# Patient Record
Sex: Female | Born: 1980 | Race: Black or African American | Hispanic: No | Marital: Single | Smoking: Never smoker
Health system: Southern US, Community
[De-identification: ages and names within clinical notes are randomized; demographics above are authoritative.]

## PROBLEM LIST (undated history)

## (undated) DIAGNOSIS — H6990 Unspecified Eustachian tube disorder, unspecified ear: Secondary | ICD-10-CM

## (undated) DIAGNOSIS — N9089 Other specified noninflammatory disorders of vulva and perineum: Secondary | ICD-10-CM

## (undated) DIAGNOSIS — C4920 Malignant neoplasm of connective and soft tissue of unspecified lower limb, including hip: Secondary | ICD-10-CM

## (undated) DIAGNOSIS — L03119 Cellulitis of unspecified part of limb: Secondary | ICD-10-CM

## (undated) DIAGNOSIS — H6980 Other specified disorders of Eustachian tube, unspecified ear: Secondary | ICD-10-CM

## (undated) DIAGNOSIS — M4120 Other idiopathic scoliosis, site unspecified: Secondary | ICD-10-CM

## (undated) DIAGNOSIS — J309 Allergic rhinitis, unspecified: Secondary | ICD-10-CM

## (undated) DIAGNOSIS — L02419 Cutaneous abscess of limb, unspecified: Secondary | ICD-10-CM

## (undated) DIAGNOSIS — R87619 Unspecified abnormal cytological findings in specimens from cervix uteri: Secondary | ICD-10-CM

## (undated) HISTORY — DX: Other specified noninflammatory disorders of vulva and perineum: N90.89

## (undated) HISTORY — PX: NO PAST SURGERIES: SHX2092

## (undated) HISTORY — DX: Allergic rhinitis, unspecified: J30.9

## (undated) HISTORY — PX: FOOT SURGERY: SHX648

## (undated) HISTORY — DX: Other idiopathic scoliosis, site unspecified: M41.20

## (undated) HISTORY — PX: KNEE SURGERY: SHX244

## (undated) HISTORY — DX: Cellulitis of unspecified part of limb: L03.119

## (undated) HISTORY — DX: Cutaneous abscess of limb, unspecified: L02.419

## (undated) HISTORY — DX: Unspecified eustachian tube disorder, unspecified ear: H69.90

## (undated) HISTORY — DX: Malignant neoplasm of connective and soft tissue of unspecified lower limb, including hip: C49.20

## (undated) HISTORY — DX: Unspecified abnormal cytological findings in specimens from cervix uteri: R87.619

## (undated) HISTORY — DX: Other specified disorders of Eustachian tube, unspecified ear: H69.80

---
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
## 2003-12-03 DIAGNOSIS — C4920 Malignant neoplasm of connective and soft tissue of unspecified lower limb, including hip: Secondary | ICD-10-CM

## 2003-12-03 HISTORY — DX: Malignant neoplasm of connective and soft tissue of unspecified lower limb, including hip: C49.20

## 2004-06-11 ENCOUNTER — Encounter (INDEPENDENT_AMBULATORY_CARE_PROVIDER_SITE_OTHER): Payer: Self-pay | Admitting: *Deleted

## 2004-06-11 ENCOUNTER — Ambulatory Visit (HOSPITAL_BASED_OUTPATIENT_CLINIC_OR_DEPARTMENT_OTHER): Admission: RE | Admit: 2004-06-11 | Discharge: 2004-06-11 | Payer: Self-pay | Admitting: General Surgery

## 2004-06-11 ENCOUNTER — Ambulatory Visit (HOSPITAL_COMMUNITY): Admission: RE | Admit: 2004-06-11 | Discharge: 2004-06-11 | Payer: Self-pay | Admitting: General Surgery

## 2004-10-17 ENCOUNTER — Ambulatory Visit: Payer: Self-pay | Admitting: Family Medicine

## 2004-10-22 ENCOUNTER — Ambulatory Visit (HOSPITAL_COMMUNITY): Admission: RE | Admit: 2004-10-22 | Discharge: 2004-10-22 | Payer: Self-pay | Admitting: Internal Medicine

## 2005-01-01 ENCOUNTER — Other Ambulatory Visit: Admission: RE | Admit: 2005-01-01 | Discharge: 2005-01-01 | Payer: Self-pay | Admitting: Obstetrics and Gynecology

## 2005-01-01 ENCOUNTER — Ambulatory Visit: Payer: Self-pay | Admitting: Obstetrics and Gynecology

## 2005-01-17 ENCOUNTER — Ambulatory Visit: Payer: Self-pay | Admitting: Obstetrics and Gynecology

## 2005-07-18 ENCOUNTER — Ambulatory Visit: Payer: Self-pay | Admitting: Obstetrics and Gynecology

## 2006-01-04 ENCOUNTER — Encounter: Payer: Self-pay | Admitting: Internal Medicine

## 2006-02-04 ENCOUNTER — Ambulatory Visit: Payer: Self-pay | Admitting: Hematology & Oncology

## 2006-02-18 ENCOUNTER — Encounter: Payer: Self-pay | Admitting: Internal Medicine

## 2006-03-10 ENCOUNTER — Encounter: Payer: Self-pay | Admitting: Internal Medicine

## 2006-03-10 ENCOUNTER — Other Ambulatory Visit: Admission: RE | Admit: 2006-03-10 | Discharge: 2006-03-10 | Payer: Self-pay | Admitting: Obstetrics and Gynecology

## 2006-03-10 HISTORY — PX: COLPOSCOPY: SHX161

## 2006-09-13 ENCOUNTER — Encounter: Payer: Self-pay | Admitting: Internal Medicine

## 2006-09-15 ENCOUNTER — Other Ambulatory Visit: Admission: RE | Admit: 2006-09-15 | Discharge: 2006-09-15 | Payer: Self-pay | Admitting: Obstetrics and Gynecology

## 2006-12-02 DIAGNOSIS — R87619 Unspecified abnormal cytological findings in specimens from cervix uteri: Secondary | ICD-10-CM

## 2006-12-02 HISTORY — DX: Unspecified abnormal cytological findings in specimens from cervix uteri: R87.619

## 2007-01-21 ENCOUNTER — Encounter: Payer: Self-pay | Admitting: Internal Medicine

## 2007-01-21 LAB — CONVERTED CEMR LAB: Pap Smear: ABNORMAL

## 2007-02-19 ENCOUNTER — Encounter: Payer: Self-pay | Admitting: Internal Medicine

## 2007-02-19 ENCOUNTER — Encounter: Admission: RE | Admit: 2007-02-19 | Discharge: 2007-02-19 | Payer: Self-pay | Admitting: Obstetrics and Gynecology

## 2007-03-31 ENCOUNTER — Emergency Department (HOSPITAL_COMMUNITY): Admission: EM | Admit: 2007-03-31 | Discharge: 2007-03-31 | Payer: Self-pay | Admitting: Family Medicine

## 2007-04-01 ENCOUNTER — Encounter: Payer: Self-pay | Admitting: Internal Medicine

## 2007-04-07 ENCOUNTER — Encounter: Payer: Self-pay | Admitting: Internal Medicine

## 2007-04-07 LAB — CONVERTED CEMR LAB: TSH: 1.847 microintl units/mL

## 2007-04-23 ENCOUNTER — Encounter: Payer: Self-pay | Admitting: Internal Medicine

## 2007-07-10 ENCOUNTER — Ambulatory Visit: Payer: Self-pay | Admitting: Internal Medicine

## 2007-07-10 ENCOUNTER — Ambulatory Visit: Payer: Self-pay | Admitting: *Deleted

## 2007-07-11 ENCOUNTER — Encounter (INDEPENDENT_AMBULATORY_CARE_PROVIDER_SITE_OTHER): Payer: Self-pay | Admitting: Nurse Practitioner

## 2007-07-11 LAB — CONVERTED CEMR LAB
ALT: 13 units/L (ref 0–35)
AST: 15 units/L (ref 0–37)
Albumin: 4.4 g/dL (ref 3.5–5.2)
Alkaline Phosphatase: 52 units/L (ref 39–117)
BUN: 10 mg/dL (ref 6–23)
Basophils Absolute: 0 10*3/uL (ref 0.0–0.1)
Basophils Relative: 0 % (ref 0–1)
CO2: 23 meq/L (ref 19–32)
Calcium: 8.8 mg/dL (ref 8.4–10.5)
Chloride: 106 meq/L (ref 96–112)
Creatinine, Ser: 0.8 mg/dL (ref 0.40–1.20)
Eosinophils Absolute: 0.1 10*3/uL (ref 0.0–0.7)
Eosinophils Relative: 2 % (ref 0–5)
Glucose, Bld: 78 mg/dL (ref 70–99)
HCT: 40.2 % (ref 36.0–46.0)
Hemoglobin: 14.1 g/dL (ref 12.0–15.0)
Lymphocytes Relative: 41 % (ref 12–46)
Lymphs Abs: 1.9 10*3/uL (ref 0.7–3.3)
MCHC: 35.1 g/dL (ref 30.0–36.0)
MCV: 79.8 fL (ref 78.0–100.0)
Monocytes Absolute: 0.3 10*3/uL (ref 0.2–0.7)
Monocytes Relative: 6 % (ref 3–11)
Neutro Abs: 2.4 10*3/uL (ref 1.7–7.7)
Neutrophils Relative %: 52 % (ref 43–77)
Platelets: 243 10*3/uL (ref 150–400)
Potassium: 4.1 meq/L (ref 3.5–5.3)
RBC: 5.04 M/uL (ref 3.87–5.11)
RDW: 14.3 % — ABNORMAL HIGH (ref 11.5–14.0)
Sodium: 139 meq/L (ref 135–145)
TSH: 1.71 microintl units/mL (ref 0.350–5.50)
Total Bilirubin: 0.4 mg/dL (ref 0.3–1.2)
Total Protein: 7.5 g/dL (ref 6.0–8.3)
WBC: 4.7 10*3/uL (ref 4.0–10.5)

## 2007-07-17 ENCOUNTER — Ambulatory Visit (HOSPITAL_COMMUNITY): Admission: RE | Admit: 2007-07-17 | Discharge: 2007-07-17 | Payer: Self-pay | Admitting: Internal Medicine

## 2007-07-17 ENCOUNTER — Ambulatory Visit: Payer: Self-pay | Admitting: Internal Medicine

## 2007-07-17 ENCOUNTER — Encounter: Payer: Self-pay | Admitting: Internal Medicine

## 2007-07-17 ENCOUNTER — Encounter (INDEPENDENT_AMBULATORY_CARE_PROVIDER_SITE_OTHER): Payer: Self-pay | Admitting: Nurse Practitioner

## 2007-07-17 DIAGNOSIS — M25579 Pain in unspecified ankle and joints of unspecified foot: Secondary | ICD-10-CM

## 2007-07-17 DIAGNOSIS — R87619 Unspecified abnormal cytological findings in specimens from cervix uteri: Secondary | ICD-10-CM | POA: Insufficient documentation

## 2007-07-18 ENCOUNTER — Encounter (INDEPENDENT_AMBULATORY_CARE_PROVIDER_SITE_OTHER): Payer: Self-pay | Admitting: Nurse Practitioner

## 2007-07-18 LAB — CONVERTED CEMR LAB
Chlamydia, DNA Probe: NEGATIVE
GC Probe Amp, Genital: NEGATIVE

## 2007-07-28 ENCOUNTER — Encounter (INDEPENDENT_AMBULATORY_CARE_PROVIDER_SITE_OTHER): Payer: Self-pay | Admitting: Nurse Practitioner

## 2007-07-28 DIAGNOSIS — J309 Allergic rhinitis, unspecified: Secondary | ICD-10-CM | POA: Insufficient documentation

## 2007-07-29 DIAGNOSIS — K029 Dental caries, unspecified: Secondary | ICD-10-CM | POA: Insufficient documentation

## 2007-08-04 ENCOUNTER — Ambulatory Visit: Payer: Self-pay | Admitting: Internal Medicine

## 2007-08-06 ENCOUNTER — Ambulatory Visit: Payer: Self-pay | Admitting: Family Medicine

## 2007-08-10 ENCOUNTER — Telehealth (INDEPENDENT_AMBULATORY_CARE_PROVIDER_SITE_OTHER): Payer: Self-pay | Admitting: Nurse Practitioner

## 2007-08-12 ENCOUNTER — Encounter (INDEPENDENT_AMBULATORY_CARE_PROVIDER_SITE_OTHER): Payer: Self-pay | Admitting: Nurse Practitioner

## 2007-08-18 ENCOUNTER — Encounter (INDEPENDENT_AMBULATORY_CARE_PROVIDER_SITE_OTHER): Payer: Self-pay | Admitting: Nurse Practitioner

## 2007-08-19 ENCOUNTER — Encounter (INDEPENDENT_AMBULATORY_CARE_PROVIDER_SITE_OTHER): Payer: Self-pay | Admitting: *Deleted

## 2007-09-18 ENCOUNTER — Other Ambulatory Visit: Admission: RE | Admit: 2007-09-18 | Discharge: 2007-09-18 | Payer: Self-pay | Admitting: Obstetrics & Gynecology

## 2007-09-18 ENCOUNTER — Ambulatory Visit: Payer: Self-pay | Admitting: Obstetrics & Gynecology

## 2007-09-18 ENCOUNTER — Encounter (INDEPENDENT_AMBULATORY_CARE_PROVIDER_SITE_OTHER): Payer: Self-pay | Admitting: Nurse Practitioner

## 2007-10-02 ENCOUNTER — Ambulatory Visit: Payer: Self-pay | Admitting: Gynecology

## 2008-03-22 ENCOUNTER — Encounter (INDEPENDENT_AMBULATORY_CARE_PROVIDER_SITE_OTHER): Payer: Self-pay | Admitting: Nurse Practitioner

## 2008-04-01 ENCOUNTER — Ambulatory Visit: Payer: Self-pay | Admitting: Gynecology

## 2008-04-01 ENCOUNTER — Encounter (INDEPENDENT_AMBULATORY_CARE_PROVIDER_SITE_OTHER): Payer: Self-pay | Admitting: Gynecology

## 2008-09-02 IMAGING — CR DG FEET 3 VIEWS BILAT
6 series · 6 of 6 positions shown · non-contrast
Comparison: none

CLINICAL DATA: Right foot swelling, left foot pain. 
 BILATERAL FEET ? 3 VIEW (EACH):
 Three views of the left foot and 3 views of the right foot were obtained.

[t foot ap left]
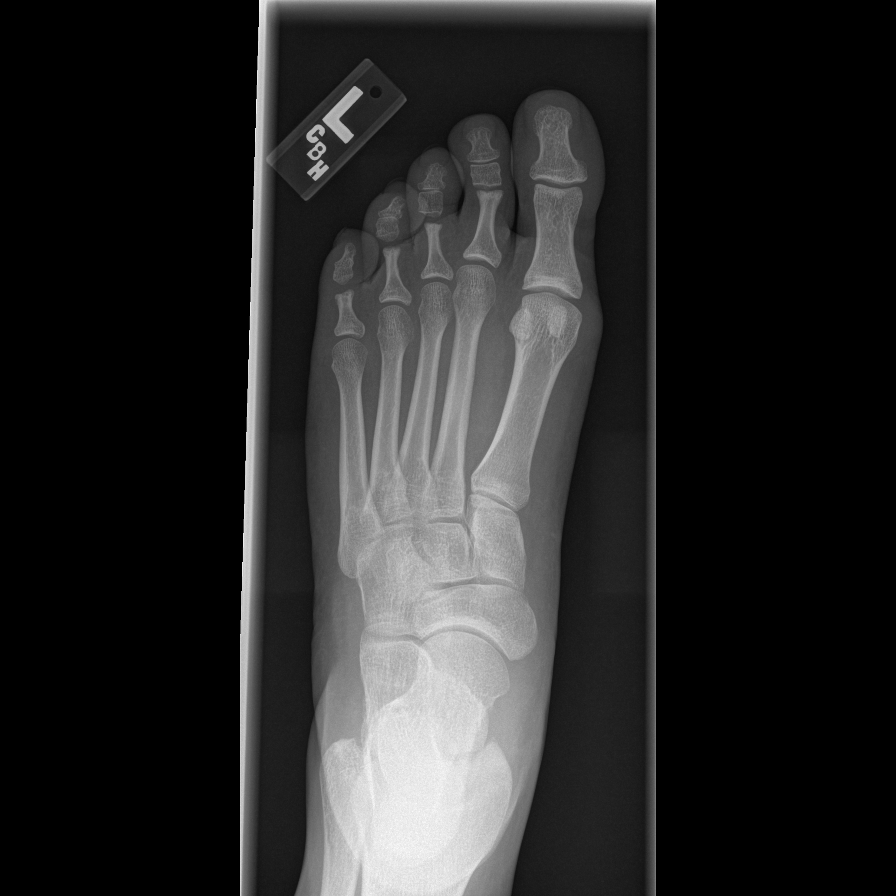

[t foot oblique left]
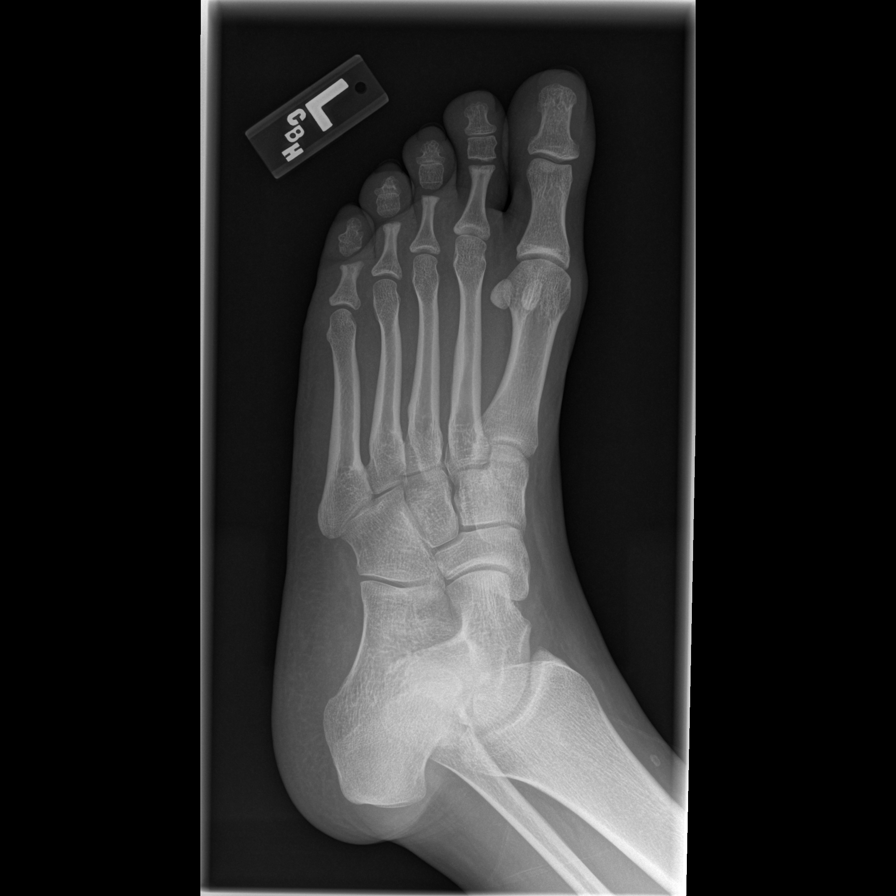

[t foot lat left]
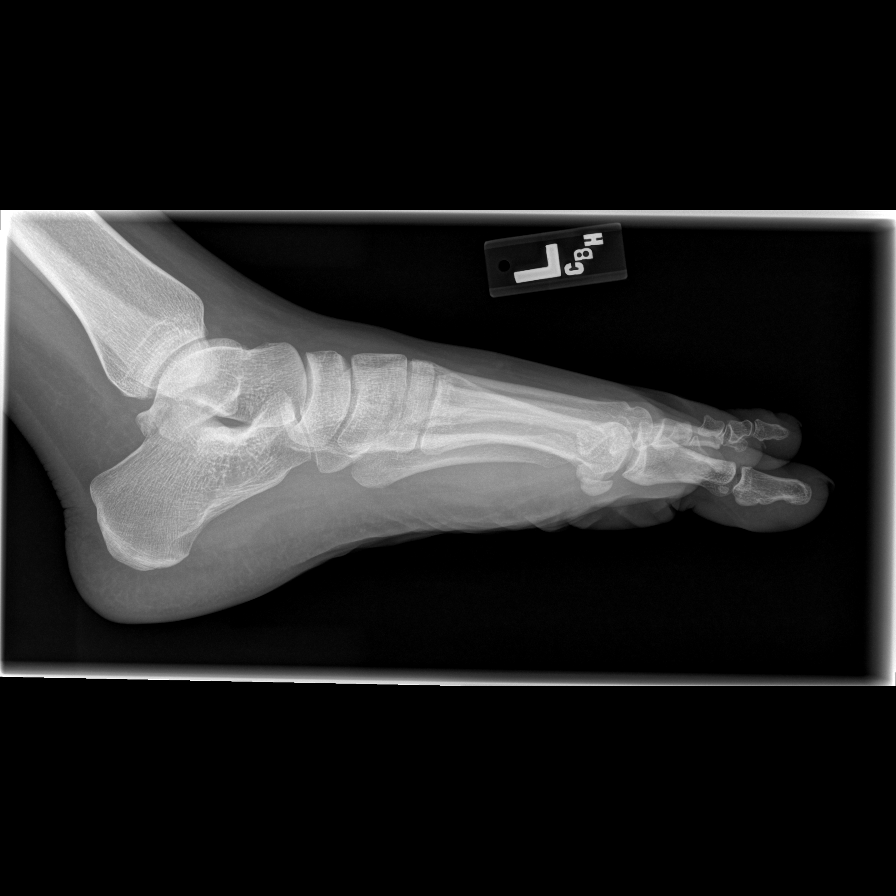

[t foot ap right]
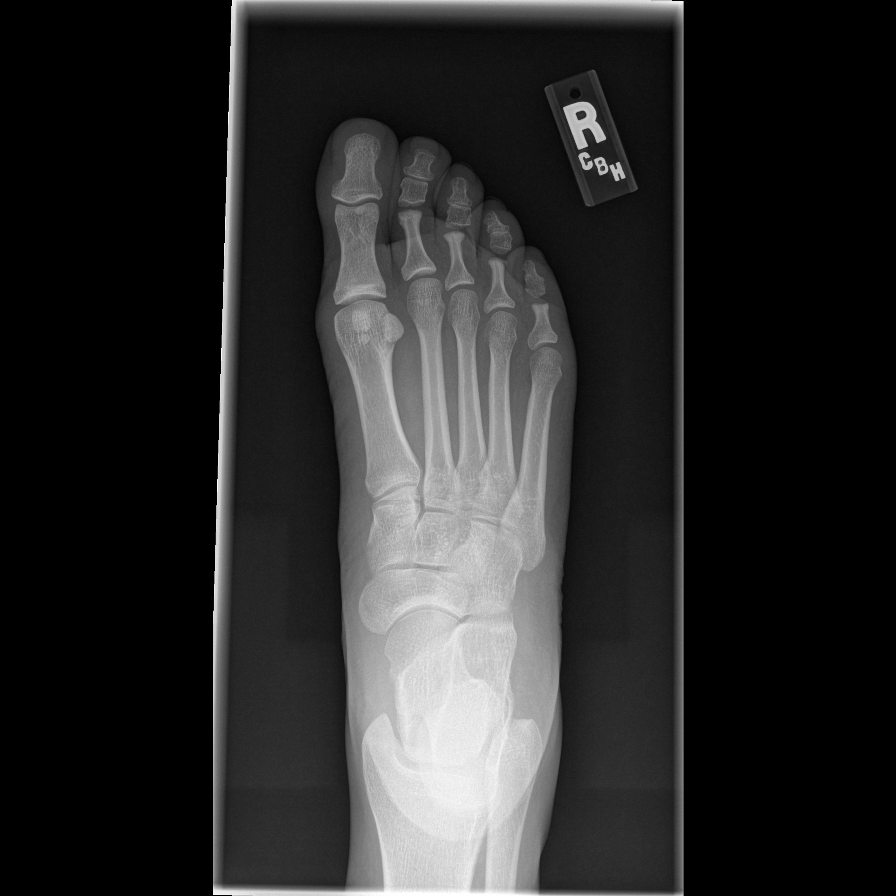

[t foot oblique right]
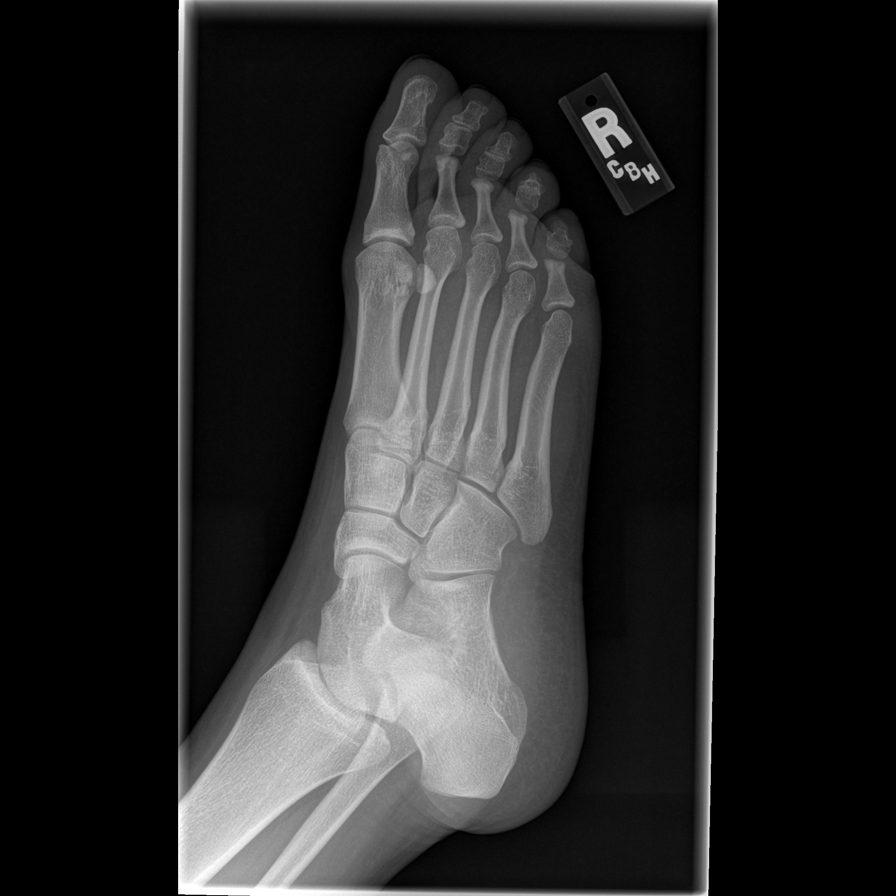

[t foot lat right]
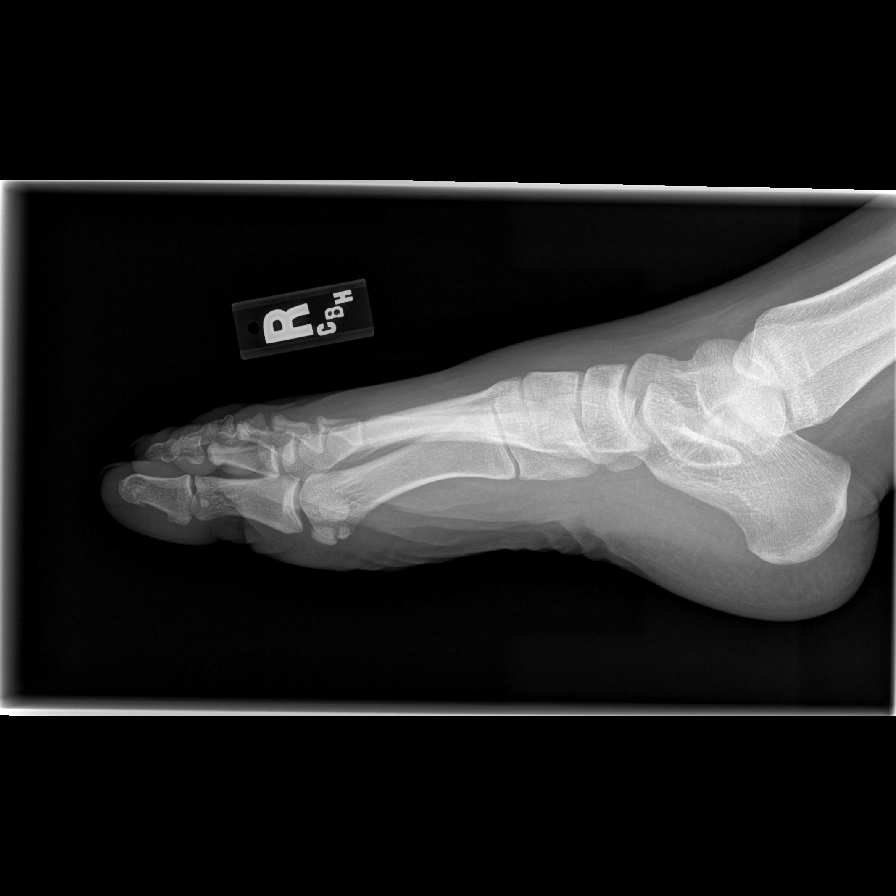

[6 of 6 positions shown; findings below may reference images not displayed]

FINDINGS: On the left, there is abnormality of the distal portion of the proximal phalanges of the 3rd, 4th and 5th toes. This appears longstanding and probably is a congenital abnormality.  No erosive process is seen.  No fracture is noted.
IMPRESSION: Probable congenital abnormality of the proximal phalanges of the 3rd, 4th and 5th toes.  No acute abnormality.
 RIGHT FOOT:
FINDINGS: Three views of the right foot show, similarly to the left, that there is abnormality of the distal portion of the proximal phalanges of the 2nd, 3rd, 4th and 5th toes.  This does appear to be a longstanding process and most likely is a congenital abnormality.  No erosion is seen.  No fracture is noted.
IMPRESSION: Abnormalities of the distal portion of the proximal phalanges of the 2nd, 3rd, 4th and 5th toes of the right foot, most likely representing a chronic process.  No acute abnormality.

## 2008-10-07 ENCOUNTER — Encounter: Payer: Self-pay | Admitting: Obstetrics & Gynecology

## 2008-10-07 ENCOUNTER — Ambulatory Visit: Payer: Self-pay | Admitting: Obstetrics & Gynecology

## 2009-03-15 ENCOUNTER — Emergency Department (HOSPITAL_COMMUNITY): Admission: EM | Admit: 2009-03-15 | Discharge: 2009-03-16 | Payer: Self-pay | Admitting: Emergency Medicine

## 2009-03-17 ENCOUNTER — Emergency Department (HOSPITAL_COMMUNITY): Admission: EM | Admit: 2009-03-17 | Discharge: 2009-03-18 | Payer: Self-pay | Admitting: Emergency Medicine

## 2009-03-18 ENCOUNTER — Encounter: Payer: Self-pay | Admitting: Internal Medicine

## 2009-03-18 DIAGNOSIS — M549 Dorsalgia, unspecified: Secondary | ICD-10-CM | POA: Insufficient documentation

## 2009-03-18 LAB — CONVERTED CEMR LAB
ALT: 18 units/L
AST: 20 units/L
Albumin: 4.3 g/dL
Alkaline Phosphatase: 62 units/L
HCT: 46.3 %
Hemoglobin: 16.1 g/dL
Lymphocytes, automated: 19 %
MCV: 82.9 fL
Monocytes Relative: 2 %
Neutrophils Relative %: 79 %
Platelets: 260 10*3/uL
RBC: 5.58 M/uL
RDW: 14.5 %
Total Bilirubin: 0.6 mg/dL
Total Protein: 8.3 g/dL
WBC: 6 10*3/uL

## 2009-04-01 LAB — CONVERTED CEMR LAB: Pap Smear: NEGATIVE

## 2009-04-14 ENCOUNTER — Ambulatory Visit: Payer: Self-pay | Admitting: Obstetrics & Gynecology

## 2009-04-14 ENCOUNTER — Encounter: Payer: Self-pay | Admitting: Obstetrics & Gynecology

## 2009-12-06 ENCOUNTER — Emergency Department (HOSPITAL_BASED_OUTPATIENT_CLINIC_OR_DEPARTMENT_OTHER): Admission: EM | Admit: 2009-12-06 | Discharge: 2009-12-06 | Payer: Self-pay | Admitting: Emergency Medicine

## 2009-12-21 ENCOUNTER — Encounter: Payer: Self-pay | Admitting: Internal Medicine

## 2009-12-22 ENCOUNTER — Ambulatory Visit: Payer: Self-pay | Admitting: Internal Medicine

## 2009-12-22 DIAGNOSIS — M4120 Other idiopathic scoliosis, site unspecified: Secondary | ICD-10-CM | POA: Insufficient documentation

## 2010-04-18 ENCOUNTER — Ambulatory Visit: Payer: Self-pay | Admitting: Obstetrics and Gynecology

## 2010-04-18 LAB — CONVERTED CEMR LAB: Pap Smear: NEGATIVE

## 2010-09-16 ENCOUNTER — Emergency Department (HOSPITAL_COMMUNITY)
Admission: EM | Admit: 2010-09-16 | Discharge: 2010-09-16 | Payer: Self-pay | Source: Home / Self Care | Admitting: Family Medicine

## 2010-12-28 ENCOUNTER — Encounter (INDEPENDENT_AMBULATORY_CARE_PROVIDER_SITE_OTHER): Payer: Self-pay | Admitting: Nurse Practitioner

## 2010-12-30 LAB — CONVERTED CEMR LAB: Pap Smear: ABNORMAL

## 2011-01-01 NOTE — Assessment & Plan Note (Signed)
Summary: NEW [employer] PT-BACK PROBLEM/BREAST- PKG--STC   Vital Signs:  Patient profile:   30 year old female Height:      67.5 inches (171.45 cm) Weight:      178.12 pounds (80.96 kg) BMI:     27.59 O2 Sat:      97 % on Room air Temp:     97.3 degrees F (36.28 degrees C) oral Pulse rate:   76 / minute BP sitting:   110 / 82  (left arm) Cuff size:   large  Vitals Entered By: Orlan Leavens (December 22, 2009 2:18 PM)  O2 Flow:  Room air CC: New patient/ Req referral for breast reduction Is Patient Diabetic? No Pain Assessment Patient in pain? no        Primary Care Provider:  Newt Lukes MD  CC:  New patient/ Req referral for breast reduction.  History of Present Illness: new pt to me and our practice - here to est care  c/o chronic right side shoulder and back pain - initially seen by dr. Regino Schultze at [facility] - (03/2009) then underwent PT for same in spring 2010 - pain improved until recently - denies precipitating event new flare of same pain about 3 weeks ago - now felt related to generous breast size -  pt requests referral for surgical reduction - also note mild-mod right apex scoli on t-spine xray 03/2009 -  pt aware of same denies any recent trauma or injury to shoulder or back  Preventive Screening-Counseling & Management  Alcohol-Tobacco     Alcohol drinks/day: <1     Alcohol Counseling: not indicated; patient does not drink     Smoking Status: never     Tobacco Counseling: not indicated; no tobacco use  Caffeine-Diet-Exercise     Does Patient Exercise: yes     Exercise Counseling: not indicated; exercise is adequate     Depression Counseling: not indicated; screening negative for depression  Safety-Violence-Falls     Firearms in the Home: no firearms in the home     Smoke Detectors: yes     Violence in the Home: not applicable     Fall Risk Counseling: not indicated; no significant falls noted  Clinical Review Panels:  Prevention   Last Pap  Smear:  Interpretation/Result:Negative for intraepithelial Lesion or Malignancy.    (04/01/2009)  Immunizations   Last Tetanus Booster:  Tdap (12/22/2009)   Last Flu Vaccine:  Historical (07/03/2004)  CBC   WBC:  6.0 (03/18/2009)   RBC:  5.58 (03/18/2009)   Hgb:  16.1 (03/18/2009)   Hct:  46.3 (03/18/2009)   Platelets:  260 (03/18/2009)   MCV  82.9 (03/18/2009)   MCHC  35.1 (07/11/2007)   RDW  14.5 (03/18/2009)   PMN:  79 (03/18/2009)   Lymphs:  41 (07/11/2007)   Monos:  2 (03/18/2009)   Eosinophils:  2 (07/11/2007)   Basophil:  0 (07/11/2007)  Complete Metabolic Panel   Glucose:  78 (07/11/2007)   Sodium:  139 (07/11/2007)   Potassium:  4.1 (07/11/2007)   Chloride:  106 (07/11/2007)   CO2:  23 (07/11/2007)   BUN:  10 (07/11/2007)   Creatinine:  0.80 (07/11/2007)   Albumin:  4.3 (03/18/2009)   Total Protein:  8.3 (03/18/2009)   Calcium:  8.8 (07/11/2007)   Total Bili:  0.6 (03/18/2009)   Alk Phos:  62 (03/18/2009)   SGPT (ALT):  18 (03/18/2009)   SGOT (AST):  20 (03/18/2009)   Current Medications (verified): 1)  Papaya Enzyme  Tabs (Digestive Enzymes) .... Once Daily 2)  Probiotic Formula  Caps (Probiotic Product) .... Once Daily 3)  Alkaline Water .... Once Daily  Allergies (verified): 1)  ! Septra 2)  ! Sulfa 3)  ! Percocet 4)  ! 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    Physical Exam  General:  alert, well-developed, well-nourished, and cooperative to examination.    Breasts:   skin/areolae normal and no masses.  generous and fibrous changes Lungs:  normal respiratory effort, no intercostal retractions or use of accessory muscles; normal breath sounds bilaterally - no crackles and no wheezes.    Heart:  normal rate, regular rhythm, no murmur, and no rub. BLE without edema. Abdomen:  soft, non-tender, normal bowel sounds, no distention; no masses and no appreciable hepatomegaly or splenomegaly.   Msk:  back: full range of motion of lumbar spine. Nontender to palpation. Negative straight leg raise. Deep tendon reflexes symmetrically intact at Achilles and patella, negative clonus. Sensation intact throughout all dermatomes in bilateral lower extremities. Full strength to manual muscle testing in all major muscule groups including EHL, anterior tibialis, gastrocnemius, quadriceps, and iliopsoas. Able to heel and toe walk without difficulty and ambulates with a normal gait.  Psych:  Oriented X3, memory intact for recent and remote, normally interactive, good eye contact, not anxious appearing, not depressed appearing, and not agitated.      Impression & Recommendations:  Problem # 1:  BACK PAIN (ICD-724.5)  chronic issue - also with shoulder pains now recurrent - has worked with ortho and PT for same in past year - now felt related to breasts no neuro abn on exam - doubt relation to scoli (though advised pt should consider eval by spine specialist at some furture date) will refer to plastics for consideration of breast reduction to help tx same  Orders: Misc. Referral (Misc. Ref)  Complete Medication List: 1)  Papaya Enzyme Tabs (Digestive enzymes) .... Once daily 2)  Probiotic Formula Caps (Probiotic product) .... Once daily 3)  Alkaline Water  .... Once daily  Other Orders: Tdap => 15yrs IM (08657) Admin 1st Vaccine (84696)  Patient Instructions: 1)  it was good to see you today.  2)  we'll make referral to plastics for consideration of breast reduction due to  your back and shoulder pain. Our office will contact you regarding this appointment once made.  3)  will send for copy of labs from [place] from your recent physical 4)  Please schedule a follow-up appointment annually, sooner if problems.    Pap Smear  Procedure date:  04/01/2009  Findings:      Interpretation/Result:Negative for intraepithelial Lesion or Malignancy.       Immunizations Administered:  Tetanus Vaccine:    Vaccine Type: Tdap    Site: left deltoid    Mfr: Sanofi Pasteur    Dose: 0.5 ml    Route: IM    Given by: Orlan Leavens    Exp. Date: 02/22/2012    Lot #: [id number]    VIS given: 12/22/09

## 2011-01-03 ENCOUNTER — Telehealth (INDEPENDENT_AMBULATORY_CARE_PROVIDER_SITE_OTHER): Payer: Self-pay | Admitting: *Deleted

## 2011-01-03 NOTE — Letter (Signed)
Summary: MAILED REQUESTED RECORDS TO [facility]  MAILED REQUESTED RECORDS TO [facility]   Imported By: Arta Bruce 12/28/2010 11:53:46  _____________________________________________________________________  External Attachment:    Type:   Image     Comment:   External Document

## 2011-01-08 ENCOUNTER — Encounter: Payer: Self-pay | Admitting: Internal Medicine

## 2011-01-09 NOTE — Progress Notes (Signed)
  Phone Note Other Incoming   Request: Send information Summary of Call: Records received from Dr. Daphine Deutscher with [facility]. 7pages forwarded to Dr. Felicity Coyer for review. Records received from  Dr. Su Hilt with [facility]. 29 pages forwarded to Dr. Felicity Coyer for review.

## 2011-01-11 ENCOUNTER — Other Ambulatory Visit: Payer: Private Health Insurance - Indemnity

## 2011-01-11 ENCOUNTER — Encounter (INDEPENDENT_AMBULATORY_CARE_PROVIDER_SITE_OTHER): Payer: Self-pay | Admitting: *Deleted

## 2011-01-11 ENCOUNTER — Other Ambulatory Visit: Payer: Self-pay | Admitting: Internal Medicine

## 2011-01-11 DIAGNOSIS — Z0000 Encounter for general adult medical examination without abnormal findings: Secondary | ICD-10-CM

## 2011-01-11 LAB — CBC WITH DIFFERENTIAL/PLATELET
Basophils Absolute: 0 10*3/uL (ref 0.0–0.1)
Basophils Relative: 0.4 % (ref 0.0–3.0)
Eosinophils Absolute: 0.1 10*3/uL (ref 0.0–0.7)
Eosinophils Relative: 1.8 % (ref 0.0–5.0)
HCT: 40.6 % (ref 36.0–46.0)
Hemoglobin: 14.2 g/dL (ref 12.0–15.0)
Lymphocytes Relative: 31.5 % (ref 12.0–46.0)
Lymphs Abs: 1.6 10*3/uL (ref 0.7–4.0)
MCHC: 34.9 g/dL (ref 30.0–36.0)
MCV: 83.4 fl (ref 78.0–100.0)
Monocytes Absolute: 0.4 10*3/uL (ref 0.1–1.0)
Monocytes Relative: 8.4 % (ref 3.0–12.0)
Neutro Abs: 2.8 10*3/uL (ref 1.4–7.7)
Neutrophils Relative %: 57.9 % (ref 43.0–77.0)
Platelets: 257 10*3/uL (ref 150.0–400.0)
RBC: 4.87 Mil/uL (ref 3.87–5.11)
RDW: 14.6 % (ref 11.5–14.6)
WBC: 4.9 10*3/uL (ref 4.5–10.5)

## 2011-01-11 LAB — LIPID PANEL
Cholesterol: 180 mg/dL (ref 0–200)
HDL: 71.3 mg/dL (ref >39.00)
LDL Cholesterol: 102 mg/dL — ABNORMAL HIGH (ref 0–99)
Total CHOL/HDL Ratio: 3
Triglycerides: 34 mg/dL (ref 0.0–149.0)
VLDL: 6.8 mg/dL (ref 0.0–40.0)

## 2011-01-11 LAB — BASIC METABOLIC PANEL
BUN: 9 mg/dL (ref 6–23)
CO2: 30 mEq/L (ref 19–32)
Calcium: 9.1 mg/dL (ref 8.4–10.5)
Chloride: 103 mEq/L (ref 96–112)
Creatinine, Ser: 0.9 mg/dL (ref 0.4–1.2)
GFR: 101.49 mL/min (ref >60.00)
Glucose, Bld: 81 mg/dL (ref 70–99)
Potassium: 4.4 mEq/L (ref 3.5–5.1)
Sodium: 138 mEq/L (ref 135–145)

## 2011-01-11 LAB — URINALYSIS
Bilirubin Urine: NEGATIVE
Hgb urine dipstick: NEGATIVE
Ketones, ur: NEGATIVE
Leukocytes, UA: NEGATIVE
Nitrite: NEGATIVE
Specific Gravity, Urine: 1.02 (ref 1.000–1.030)
Total Protein, Urine: NEGATIVE
Urine Glucose: NEGATIVE
Urobilinogen, UA: 0.2 (ref 0.0–1.0)
pH: 7.5 (ref 5.0–8.0)

## 2011-01-11 LAB — HEPATIC FUNCTION PANEL
ALT: 19 U/L (ref 0–35)
AST: 18 U/L (ref 0–37)
Albumin: 3.7 g/dL (ref 3.5–5.2)
Alkaline Phosphatase: 49 U/L (ref 39–117)
Bilirubin, Direct: 0.1 mg/dL (ref 0.0–0.3)
Total Bilirubin: 0.6 mg/dL (ref 0.3–1.2)
Total Protein: 6.8 g/dL (ref 6.0–8.3)

## 2011-01-11 LAB — TSH: TSH: 0.87 u[IU]/mL (ref 0.35–5.50)

## 2011-01-17 NOTE — Miscellaneous (Signed)
Summary: Vaccine  Records  Vaccine  Records   Imported By: Lanelle Bal 01/11/2011 11:08:47  _____________________________________________________________________  External Attachment:    Type:   Image     Comment:   External Document

## 2011-01-18 ENCOUNTER — Telehealth (INDEPENDENT_AMBULATORY_CARE_PROVIDER_SITE_OTHER): Payer: Self-pay | Admitting: *Deleted

## 2011-01-18 ENCOUNTER — Encounter: Payer: Self-pay | Admitting: Internal Medicine

## 2011-01-18 ENCOUNTER — Encounter (INDEPENDENT_AMBULATORY_CARE_PROVIDER_SITE_OTHER): Payer: Private Health Insurance - Indemnity | Admitting: Internal Medicine

## 2011-01-18 DIAGNOSIS — Z0000 Encounter for general adult medical examination without abnormal findings: Secondary | ICD-10-CM

## 2011-01-23 NOTE — Assessment & Plan Note (Signed)
Summary: cpx-ss   Vital Signs:  Patient profile:   30 year old female Height:      67.5 inches (171.45 cm) Weight:      188.4 pounds (85.64 kg) BMI:     29.18 O2 Sat:      98 % on Room air Temp:     98.4 degrees F (36.89 degrees C) oral Pulse rate:   82 / minute BP sitting:   122 / 86  (left arm) Cuff size:   large  Vitals Entered By: Orlan Leavens RMA (January 18, 2011 8:07 AM)  O2 Flow:  Room air CC: CPX Is Patient Diabetic? No Pain Assessment Patient in pain? no        Primary Care Provider:  Newt Lukes MD  CC:  CPX.  History of Present Illness: patient is here today for annual physical. Patient feels well and has no complaints.   reviewed prior OV 12/2009 (intial visit)  c/o chronic right side shoulder and back pain - initially seen by dr. Regino Schultze at [facility] - (03/2009) then underwent PT for same in spring 2010 - pain improved until recently - denies precipitating event new flare of same pain about 3 weeks ago - now felt related to generous breast size -  pt requests referral for surgical reduction - also note mild-mod right apex scoli on t-spine xray 03/2009 -  pt aware of same denies any recent trauma or injury to shoulder or back  Preventive Screening-Counseling & Management  Alcohol-Tobacco     Alcohol drinks/day: <1     Alcohol Counseling: not indicated; patient does not drink     Smoking Status: never     Tobacco Counseling: not indicated; no tobacco use  Caffeine-Diet-Exercise     Does Patient Exercise: yes     Exercise Counseling: not indicated; exercise is adequate     Depression Counseling: not indicated; screening negative for depression  Safety-Violence-Falls     Firearms in the Home: no firearms in the home     Smoke Detectors: yes     Violence in the Home: not applicable     Fall Risk Counseling: not indicated; no significant falls noted  Clinical Review Panels:  Prevention   Last Pap Smear:  NEGATIVE FOR INTRAEPITHELIAL  LESIONS OR MALIGNANCY. BENIGN REACTIVE/REPARATIVE CHANGES. (04/18/2010)  Immunizations   Last Tetanus Booster:  Tdap (12/22/2009)   Last Flu Vaccine:  Historical (07/03/2004)  Lipid Management   Cholesterol:  180 (01/11/2011)   LDL (bad choesterol):  102 (01/11/2011)   HDL (good cholesterol):  71.30 (01/11/2011)  CBC   WBC:  4.9 (01/11/2011)   RBC:  4.87 (01/11/2011)   Hgb:  14.2 (01/11/2011)   Hct:  40.6 (01/11/2011)   Platelets:  257.0 (01/11/2011)   MCV  83.4 (01/11/2011)   MCHC  34.9 (01/11/2011)   RDW  14.6 (01/11/2011)   PMN:  57.9 (01/11/2011)   Lymphs:  31.5 (01/11/2011)   Monos:  8.4 (01/11/2011)   Eosinophils:  1.8 (01/11/2011)   Basophil:  0.4 (01/11/2011)  Complete Metabolic Panel   Glucose:  81 (01/11/2011)   Sodium:  138 (01/11/2011)   Potassium:  4.4 (01/11/2011)   Chloride:  103 (01/11/2011)   CO2:  30 (01/11/2011)   BUN:  9 (01/11/2011)   Creatinine:  0.9 (01/11/2011)   Albumin:  3.7 (01/11/2011)   Total Protein:  6.8 (01/11/2011)   Calcium:  9.1 (01/11/2011)   Total Bili:  0.6 (01/11/2011)   Alk Phos:  49 (01/11/2011)  SGPT (ALT):  19 (01/11/2011)   SGOT (AST):  18 (01/11/2011)   Current Medications (verified): 1)  None  Allergies (verified): 1)  ! Septra 2)  ! Sulfa 3)  ! Percocet 4)  ! 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 I have reviewed all other systems and they were negative.   Physical Exam  General:  alert,  well-developed, well-nourished, and cooperative to examination.    Head:  Normocephalic and atraumatic without obvious abnormalities. No apparent alopecia or balding. Eyes:  vision grossly intact; pupils equal, round and reactive to light.  conjunctiva and lids normal.    Ears:  normal pinnae bilaterally, without erythema, swelling, or tenderness to palpation. TMs clear, without effusion, or cerumen impaction. Hearing grossly normal bilaterally  Mouth:  teeth and gums in good repair; mucous membranes moist, without lesions or ulcers. oropharynx clear without exudate, no erythema.  Neck:  supple, full ROM, no masses, no thyromegaly; no thyroid nodules or tenderness. no JVD or carotid bruits.   Lungs:  normal respiratory effort, no intercostal retractions or use of accessory muscles; normal breath sounds bilaterally - no crackles and no wheezes.    Heart:  normal rate, regular rhythm, no murmur, and no rub. BLE without edema. normal DP pulses and normal cap refill in all 4 extremities    Abdomen:  soft, non-tender, normal bowel sounds, no distention; no masses and no appreciable hepatomegaly or splenomegaly.   Genitalia:  defer gyn Msk:  No deformity or scoliosis noted of thoracic or lumbar spine.   Neurologic:  alert & oriented X3 and cranial nerves II-XII symetrically intact.  strength normal in all extremities, sensation intact to light touch, and gait normal. speech fluent without dysarthria or aphasia; follows commands with good comprehension.  Skin:  no rashes, vesicles, ulcers, or erythema. No nodules or irregularity to palpation.  Psych:  Oriented X3, memory intact for recent and remote, normally interactive, good eye contact, not anxious appearing, not depressed appearing, and not agitated.      Impression & Recommendations:  Problem # 1:  PREVENTIVE HEALTH CARE (ICD-V70.0) Patient has been counseled on age-appropriate routine health concerns for screening and prevention. These are reviewed  and up-to-date. Immunizations are up-to-date or declined. Labs reviewed.   Patient Instructions: 1)  it was good to see you today. 2)  labs and exam today look good 3)  it is important that you work on losing weight - monitor your diet and consume fewer calories such as less carbohydrates (sugar) and less fat. you also need to increase your physical activity level - start by walking for 10-20 minutes 3 times per week and work up to 30 minutes 4-5 times each week.  4)  Please schedule a follow-up appointment in annually for physical and labs, call sooner if problems.    Orders Added: 1)  Est. Patient 18-39 years [99395]

## 2011-01-23 NOTE — Progress Notes (Signed)
  Phone Note Other Incoming   Request: Send information Summary of Call: Patient delivered records from [facility]. Records forwarded to Dr. Felicity Coyer for review.

## 2011-01-29 NOTE — Letter (Signed)
Summary: [facility]  [facility]   Imported By: Sherian Rein 01/22/2011 09:28:36  _____________________________________________________________________  External Attachment:    Type:   Image     Comment:   External Document

## 2011-01-29 NOTE — Op Note (Signed)
Summary: [facility]  [facility]   Imported By: Sherian Rein 01/24/2011 10:38:09  _____________________________________________________________________  External Attachment:    Type:   Image     Comment:   External Document

## 2011-01-29 NOTE — Letter (Signed)
Summary: [facility]  [facility]   Imported By: Sherian Rein 01/22/2011 09:26:40  _____________________________________________________________________  External Attachment:    Type:   Image     Comment:   External Document

## 2011-01-29 NOTE — Miscellaneous (Signed)
Summary: [facility]  [facility]   Imported By: Sherian Rein 01/24/2011 10:39:13  _____________________________________________________________________  External Attachment:    Type:   Image     Comment:   External Document

## 2011-02-11 ENCOUNTER — Emergency Department (HOSPITAL_COMMUNITY)
Admission: EM | Admit: 2011-02-11 | Discharge: 2011-02-11 | Disposition: A | Discharge status: Home / Self Care | Payer: Managed Care, Other (non HMO) | Attending: Emergency Medicine | Admitting: Emergency Medicine

## 2011-02-11 ENCOUNTER — Ambulatory Visit (INDEPENDENT_AMBULATORY_CARE_PROVIDER_SITE_OTHER): Payer: Managed Care, Other (non HMO) | Admitting: Internal Medicine

## 2011-02-11 ENCOUNTER — Encounter: Payer: Self-pay | Admitting: Internal Medicine

## 2011-02-11 DIAGNOSIS — L02419 Cutaneous abscess of limb, unspecified: Secondary | ICD-10-CM

## 2011-02-11 DIAGNOSIS — M79609 Pain in unspecified limb: Secondary | ICD-10-CM | POA: Insufficient documentation

## 2011-02-11 DIAGNOSIS — L298 Other pruritus: Secondary | ICD-10-CM | POA: Insufficient documentation

## 2011-02-11 DIAGNOSIS — L2989 Other pruritus: Secondary | ICD-10-CM | POA: Insufficient documentation

## 2011-02-11 DIAGNOSIS — L03119 Cellulitis of unspecified part of limb: Secondary | ICD-10-CM | POA: Insufficient documentation

## 2011-02-11 DIAGNOSIS — R21 Rash and other nonspecific skin eruption: Secondary | ICD-10-CM | POA: Insufficient documentation

## 2011-02-11 DIAGNOSIS — F411 Generalized anxiety disorder: Secondary | ICD-10-CM | POA: Insufficient documentation

## 2011-02-11 HISTORY — DX: Cutaneous abscess of limb, unspecified: L02.419

## 2011-02-11 LAB — DIFFERENTIAL
Basophils Absolute: 0 10*3/uL (ref 0.0–0.1)
Basophils Relative: 0 % (ref 0–1)
Eosinophils Absolute: 0.1 10*3/uL (ref 0.0–0.7)
Eosinophils Relative: 1 % (ref 0–5)
Lymphocytes Relative: 22 % (ref 12–46)
Lymphs Abs: 1.6 10*3/uL (ref 0.7–4.0)
Monocytes Absolute: 0.5 10*3/uL (ref 0.1–1.0)
Monocytes Relative: 7 % (ref 3–12)
Neutro Abs: 5 10*3/uL (ref 1.7–7.7)
Neutrophils Relative %: 69 % (ref 43–77)

## 2011-02-11 LAB — CBC
HCT: 37.2 % (ref 36.0–46.0)
Hemoglobin: 13.4 g/dL (ref 12.0–15.0)
MCH: 27.7 pg (ref 26.0–34.0)
MCHC: 36 g/dL (ref 30.0–36.0)
MCV: 76.9 fL — ABNORMAL LOW (ref 78.0–100.0)
Platelets: 273 10*3/uL (ref 150–400)
RBC: 4.84 MIL/uL (ref 3.87–5.11)
RDW: 14.2 % (ref 11.5–15.5)
WBC: 7.2 10*3/uL (ref 4.0–10.5)

## 2011-02-13 ENCOUNTER — Encounter: Payer: Self-pay | Admitting: Internal Medicine

## 2011-02-13 ENCOUNTER — Ambulatory Visit (INDEPENDENT_AMBULATORY_CARE_PROVIDER_SITE_OTHER): Payer: Managed Care, Other (non HMO) | Admitting: Internal Medicine

## 2011-02-13 DIAGNOSIS — L02419 Cutaneous abscess of limb, unspecified: Secondary | ICD-10-CM

## 2011-02-14 ENCOUNTER — Telehealth: Payer: Self-pay | Admitting: Internal Medicine

## 2011-02-14 LAB — CULTURE, ROUTINE-ABSCESS

## 2011-02-15 ENCOUNTER — Telehealth (INDEPENDENT_AMBULATORY_CARE_PROVIDER_SITE_OTHER): Payer: Self-pay | Admitting: *Deleted

## 2011-02-19 NOTE — Assessment & Plan Note (Signed)
Summary: ?bite lower abd while in [place]/cd   Vital Signs:  Patient profile:   30 year old female O2 Sat:      97 % on Room air Temp:     98.6 degrees F (37.00 degrees C) oral Pulse rate:   85 / minute BP sitting:   132 / 72  (left arm) Cuff size:   regular  Vitals Entered By: Orlan Leavens RMA (February 11, 2011 1:40 PM)  O2 Flow:  Room air CC: ? spider bite on (R) side while she was in [place]  Is Patient Diabetic? No Pain Assessment Patient in pain? no        Primary Care Provider:  Newt Lukes MD  CC:  ? spider bite on (R) side while she was in [place] .  History of Present Illness: concern about lesion on abd onset 1 wk ago while traveling in [place] - city and country started as "whitehead pimple" but ulcerated after manipulation no fever but +clear drainage increasing pain and redness located over right ant iliac crest taking malaria prophlaxis before and during trip - ?ok to cont now  Clinical Review Panels:  CBC   WBC:  4.9 (01/11/2011)   RBC:  4.87 (01/11/2011)   Hgb:  14.2 (01/11/2011)   Hct:  40.6 (01/11/2011)   Platelets:  257.0 (01/11/2011)   MCV  83.4 (01/11/2011)   MCHC  34.9 (01/11/2011)   RDW  14.6 (01/11/2011)   PMN:  57.9 (01/11/2011)   Lymphs:  31.5 (01/11/2011)   Monos:  8.4 (01/11/2011)   Eosinophils:  1.8 (01/11/2011)   Basophil:  0.4 (01/11/2011)  Complete Metabolic Panel   Glucose:  81 (01/11/2011)   Sodium:  138 (01/11/2011)   Potassium:  4.4 (01/11/2011)   Chloride:  103 (01/11/2011)   CO2:  30 (01/11/2011)   BUN:  9 (01/11/2011)   Creatinine:  0.9 (01/11/2011)   Albumin:  3.7 (01/11/2011)   Total Protein:  6.8 (01/11/2011)   Calcium:  9.1 (01/11/2011)   Total Bili:  0.6 (01/11/2011)   Alk Phos:  49 (01/11/2011)   SGPT (ALT):  19 (01/11/2011)   SGOT (AST):  18 (01/11/2011)   Current Medications (verified): 1)  None  Allergies (verified): 1)  ! Septra 2)  ! Sulfa 3)  ! Percocet 4)  ! 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    Physical Exam  General:  alert, well-developed, well-nourished, and cooperative to examination.   nontoxic Lungs:  normal respiratory effort, no intercostal retractions or use of accessory muscles; normal breath sounds bilaterally - no crackles and no wheezes.    Heart:  normal rate, regular rhythm, no murmur, and no rub. BLE without edema. normal DP pulses and normal cap refill in all 4 extremities    Skin:  indurated area of cellulitis 1.5x4 cm with entral ulceration - serous drainage, no fluctuence appreciated   Impression & Recommendations:  Problem # 1:  CELLULITIS, THIGH, RIGHT (ICD-682.6)  ?bacterial or parasitic given [place] exposure - other mosquitop bites but those healed emperic doxy for assoc cellulitis - erx done and refer to ID for further eval and tx same  Her updated medication list for this problem includes:    Doxycycline Hyclate 100 Mg  Caps (Doxycycline hyclate) .Marland Kitchen... 1 by mouth two times a day x 10 d  Orders: Infectious Disease Referral (ID) Prescription Created Electronically [phone])  Complete Medication List: 1)  Doxycycline Hyclate 100 Mg Caps (Doxycycline hyclate) .Marland Kitchen.. 1 by mouth two times a day x 10 d 2)  Ibuprofen 600 Mg Tabs (Ibuprofen) .Marland Kitchen.. 1 by mouth four times a day as needed for pain 3)  Malarone 250-100 Mg Tabs (Atovaquone-proguanil hcl) .... As directed (complete course rx'd)  Patient Instructions: 1)  it was good to see you today. 2)  doxycycline antibiotic for skin infection- your prescription has been electronically submitted to your pharmacy. Please take as directed. Contact our office if you believe you're having problems with the  medication(s).  3)  ibuprofen as needed for pain symptoms  4)  we'll make referral to infectious disease clinic for further evaluation and treatment. Our office will contact you regarding this appointment once made.  5)  Please schedule a follow-up appointment in 2 weeks if unimproved, call sooner if problems.  Prescriptions: IBUPROFEN 600 MG TABS (IBUPROFEN) 1 by mouth four times a day as needed for pain  #40 x 0   Entered and Authorized by:   Newt Lukes MD   Signed by:   Newt Lukes MD on 02/11/2011   Method used:   Electronically to        [id number] (retail)       [address] [place], [place]  [address]       Ph: [phone]       Fax: [phone]   RxID:   [id number] DOXYCYCLINE HYCLATE 100 MG CAPS (DOXYCYCLINE HYCLATE) 1 by mouth two times a day x 10 d  #20 x 0   Entered and Authorized by:   Newt Lukes MD   Signed by:   Newt Lukes MD on 02/11/2011   Method used:   Electronically to        [facility] (retail)       [address] [place], [place]  [address]       Ph: [phone]       Fax: [phone]   RxID:   [id number]    Orders Added: 1)  Infectious Disease Referral [ID] 2)  Est. Patient Level IV [60630] 3)  Prescription Created Electronically [phone]

## 2011-02-19 NOTE — Assessment & Plan Note (Signed)
Summary: remove packing thigh--post [place] er/cd   Vital Signs:  Patient profile:   30 year old female O2 Sat:      97 % on Room air Temp:     97.9 degrees F (36.61 degrees C) oral Pulse rate:   63 / minute BP sitting:   120 / 72  (left arm) Cuff size:   regular  Vitals Entered By: Orlan Leavens RMA (February 13, 2011 4:05 PM)  O2 Flow:  Room air CC: Removed  packing Is Patient Diabetic? No Pain Assessment Patient in pain? no        Primary Care Provider:  Newt Lukes MD  CC:  Removed  packing.  History of Present Illness: here for f/u - seen here 48h ago for lesion on right thigh onset 10d ago while traveling in [place] - city and countryside started as "whitehead pimple" but ulcerated after manipulation no fever but +clear drainage increasing pain and redness taking malaria prophlaxis before and during trip - ?ok to cont now i&d not performed at ov due to hardness and no fluctuence on exam - but began to spont drain after eval went to ED - abscess i&d, then irrigation feels better, less groin tenderness  Current Medications (verified): 1)  Doxycycline Hyclate 100 Mg Caps (Doxycycline Hyclate) .Marland Kitchen.. 1 By Mouth Two Times A Day X 10 D 2)  Ibuprofen 600 Mg Tabs (Ibuprofen) .Marland Kitchen.. 1 By Mouth Four Times A Day As Needed For Pain 3)  Malarone 250-100 Mg Tabs (Atovaquone-Proguanil Hcl) .... As Directed (Complete Course Rx'd)  Allergies (verified): 1)  ! Septra 2)  ! Sulfa 3)  ! Percocet 4)  ! 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    Physical Exam  General:  alert, well-developed, well-nourished, and cooperative to examination.   nontoxic - mom at side Skin:   prev indurated area of cellulitis 1.5x4 cm with central ulceration - serous drainage, no fluctuence appreciated -  now with packing:  - removed to expose clean based ulcer w/o residual pus   Impression & Recommendations:  Problem # 1:  CELLULITIS, THIGH, RIGHT (ICD-682.6) s/p I&D of assoc abscess - ED note and labs reviewed prelim cx with abundant SA, no sens yet - wound care reviewed - cont doxy pending final cx results - plan 2+ week total abx course (refill doxy if cx sens to same) Her updated medication list for this problem includes:    Doxycycline Hyclate 100 Mg Caps (Doxycycline hyclate) .Marland Kitchen... 1 by mouth two times a day x 10 d  Complete Medication List: 1)  Doxycycline Hyclate 100 Mg Caps (Doxycycline hyclate) .Marland Kitchen.. 1 by mouth two times a day x 10 d 2)  Ibuprofen 600 Mg Tabs (Ibuprofen) .Marland Kitchen.. 1 by mouth four times a day as needed for pain 3)  Malarone 250-100 Mg Tabs (Atovaquone-proguanil hcl) .... As directed (complete course rx'd)  Patient Instructions: 1)  it was good to see you today. 2)  packing removed today - wound looks good 3)  keep clean as discussed - irrigate and wash with warm, soapy water or diluted peroxide solution 2-3x/day - apply bacitracin after washing, then cover 4)  call if worse - fever, drainage, pain, etc - as dsicsussed 5)  prelim culture shows staph aureus (doxy antibiotic appropriate) - will call with final results in next 48h - call if questions 6)  Please schedule a follow-up appointment as needed.   Orders Added: 1)  Est. Patient Level III [16109]

## 2011-02-19 NOTE — Progress Notes (Signed)
Summary: culture results - MRSA - cont doxy, refill abx done  ---- Converted from flag ---- ---- 02/13/2011 4:53 PM, Newt Lukes MD wrote: check cx - ?mssa or mrsa or other ------------------------------  followup final culture results show MRSA from hip abscess- please call pt with these results -  sensitive to doxy - cont abx as we discussed at OV - refill on doxy erx done- thanks Newt Lukes MD  February 14, 2011 12:39 PM    Prescriptions: DOXYCYCLINE HYCLATE 100 MG CAPS (DOXYCYCLINE HYCLATE) 1 by mouth two times a day x 10 d  #20 x 0   Entered by:   Orlan Leavens RMA   Authorized by:   Newt Lukes MD   Signed by:   Orlan Leavens RMA on 02/14/2011   Method used:   Electronically to        [facility] [id number] (retail)       [address] [place], [place]  [address]       Ph: [phone]       Fax: [phone]   RxID:   [id number] DOXYCYCLINE HYCLATE 100 MG CAPS (DOXYCYCLINE HYCLATE) 1 by mouth two times a day x 10 d  #20 x 0   Entered and Authorized by:   Newt Lukes MD   Signed by:   Newt Lukes MD on 02/14/2011   Method used:   Electronically to        [facility] [phone]* (retail)       [address]       [place], [place]  [address]       Ph: [phone]       Fax: [phone]   RxID:   [id number]  Called pt no ansew LMOM RTC concerning labs........02/14/11@1 :46pm/LMB  Pt return call back gave results concerning culture. Rx was sent to [facility] pt need rx to go to [facility]. Sent & notofied [facility] not to fill...Marland KitchenMarland Kitchen3/15/12@1 :56pm/LMB

## 2011-02-28 ENCOUNTER — Encounter: Payer: Self-pay | Admitting: Internal Medicine

## 2011-02-28 NOTE — Progress Notes (Signed)
Summary: ID Referral  Phone Note From Other Clinic   Summary of Call: Dr Jeri Lager from [facility] called need to know if patient still need referral .Please advise.   Thanks Initial call taken by: Dagoberto Reef,  February 15, 2011 4:29 PM  Follow-up for Phone Call        no - may cancel ID referral - thanks Follow-up by: Newt Lukes MD,  February 18, 2011 8:18 AM  Additional Follow-up for Phone Call Additional follow up Details #1::        Called kim @ [phone] and cancelled referral. Additional Follow-up by: Dagoberto Reef,  February 18, 2011 9:38 AM

## 2011-03-04 ENCOUNTER — Encounter: Payer: Self-pay | Admitting: Internal Medicine

## 2011-03-04 ENCOUNTER — Ambulatory Visit (INDEPENDENT_AMBULATORY_CARE_PROVIDER_SITE_OTHER): Payer: Managed Care, Other (non HMO) | Admitting: Internal Medicine

## 2011-03-04 VITALS — BP 132/82 | HR 74 | Temp 98.3°F | Ht 67.5 in | Wt 192.0 lb

## 2011-03-04 DIAGNOSIS — L02419 Cutaneous abscess of limb, unspecified: Secondary | ICD-10-CM

## 2011-03-04 NOTE — Progress Notes (Signed)
  Subjective:    Patient ID: Nicole Whitehead, female    DOB: 01/01/1981, 30 y.o.   MRN: [id number]  HPI Here for follow up  here for f/u - seen here 02/11/11 for lesion on right thigh onset 10d ago while traveling in [place] - city and countryside started as "whitehead pimple" but ulcerated after manipulation no fever but +clear drainage increasing pain and redness i&d not performed at ov due to hardness and no fluctuence on exam - but began to spont drain after eval went to ED 02/12/11- abscess i&d, then irrigation feels Whitehead, less groin tenderness Completed 2 weeks doxy - no fever or drainage, wound closed  Past Medical History  Diagnosis Date  . SCOLIOSIS, MILD 12/22/2009  . PAP SMEAR, ABNORMAL 07/17/2007  . Allergy   . S/P foot surgery    Review of Systems  Constitutional: Negative for fever and fatigue.  Musculoskeletal: Negative for myalgias.  Skin: Negative for color change and rash.      Objective:   Physical Exam  Constitutional: She appears well-developed and well-nourished. No distress.  Cardiovascular: Normal rate, regular rhythm and normal heart sounds.   Pulmonary/Chest: Effort normal and breath sounds normal. She has no wheezes.  Skin:       Well healed, closed wound 2mm round over right lateral thigh/hip - no cellulitis, no drainage, nontender   BP 132/82  Pulse 74  Temp(Src) 98.3 F (36.8 C) (Oral)  Ht 5' 7.5" (1.715 m)  Wt 192 lb (87.091 kg)  BMI 29.63 kg/m2    .  Assessment & Plan:  See problem list. Medications and labs reviewed today. Time spent with the patient today: 25 minutes, greater than 50% time spent counseling patient on wound healing, MRSA education and medication review. Also review of prior records

## 2011-03-04 NOTE — Assessment & Plan Note (Signed)
followup exam today: resolved status post I&D of MRSA abscess and 2 weeks doxy Reassurance provided and education on same

## 2011-03-04 NOTE — Patient Instructions (Signed)
It was good to see you today. Questions and concerns reviewed today - no changes recommended today Please schedule followup annually for physical and labs, call sooner if problems.

## 2011-03-13 LAB — DIFFERENTIAL
Eosinophils Absolute: 0 10*3/uL (ref 0.0–0.7)
Lymphocytes Relative: 19 % (ref 12–46)
Lymphs Abs: 1.1 10*3/uL (ref 0.7–4.0)
Neutro Abs: 4.8 10*3/uL (ref 1.7–7.7)
Neutrophils Relative %: 79 % — ABNORMAL HIGH (ref 43–77)

## 2011-03-13 LAB — URINALYSIS, ROUTINE W REFLEX MICROSCOPIC
Bilirubin Urine: NEGATIVE
Glucose, UA: NEGATIVE mg/dL
Hgb urine dipstick: NEGATIVE
Ketones, ur: 15 mg/dL — AB
Nitrite: NEGATIVE
Protein, ur: NEGATIVE mg/dL
Specific Gravity, Urine: 1.034 — ABNORMAL HIGH (ref 1.005–1.030)
Urobilinogen, UA: 0.2 mg/dL (ref 0.0–1.0)
pH: 6.5 (ref 5.0–8.0)

## 2011-03-13 LAB — POCT PREGNANCY, URINE: Preg Test, Ur: NEGATIVE

## 2011-03-13 LAB — HEPATIC FUNCTION PANEL
ALT: 18 U/L (ref 0–35)
Albumin: 4.3 g/dL (ref 3.5–5.2)
Alkaline Phosphatase: 62 U/L (ref 39–117)
Total Protein: 8.3 g/dL (ref 6.0–8.3)

## 2011-03-13 LAB — URINE MICROSCOPIC-ADD ON

## 2011-03-13 LAB — CBC
Platelets: 260 10*3/uL (ref 150–400)
WBC: 6 10*3/uL (ref 4.0–10.5)

## 2011-04-16 NOTE — Group Therapy Note (Signed)
Nicole Whitehead, Nicole Whitehead           ACCOUNT NO.:  [id number]   MEDICAL RECORD NO.:  [id number]          PATIENT TYPE:  WOC   LOCATION:  [facility]                   FACILITY:  [facility]   PHYSICIAN:  Scheryl Darter, MD       DATE OF BIRTH:  Aug 02, 1981   DATE OF SERVICE:                                  CLINIC NOTE   The patient comes for her annual exam.  The patient is a 30 year old  black female gravida 0, last menstrual period Apr 06, 2009, with regular  menses over the last for about 2-3 days.  She is currently abstinent.  The patient has been followed in this clinic due to history of moderate  dysplasia.  Pap smear on October 07, 2008, was negative.   PAST MEDICAL HISTORY:  Cervical dysplasia.   PAST SURGICAL HISTORY:  None.   SOCIAL HISTORY:  The patient is single.  She denies alcohol, tobacco, or  drug use.   FAMILY HISTORY:  Breast cancer in her father's sister.   MEDICATIONS:  Multivitamin daily and ibuprofen p.r.n. pain.   REVIEW OF SYSTEMS:  The patient denies any respiratory or abdominal  symptoms.  No urinary or vaginal complaints.  Regular menses.   PHYSICAL EXAMINATION:  GENERAL:  The patient is in no acute distress.  VITAL SIGNS:  Weight 177 pounds, height 5 feet 7-1/2 inches, BP 116/71,  pulse 82, and temperature 97.7.  CHEST:  Clear.  HEART:  Regular rate and rhythm.  No thyromegaly.  BREASTS:  Without masses or tenderness.  ABDOMEN:  Soft, nontender, no mass.  EXTREMITIES:  There are no swelling.  PELVIC:  External genitalia, vagina and cervix appeared normal.  Pap was  done.  Uterus normal size.  No adnexal masses or tenderness.   IMPRESSION:  History of low-grade squamous intraepithelial lesion.   PLAN:  If today's Pap smear is normal, she can return for yearly  followup.  Currently, she is abstinent and requires no birth control and  she should notify [place] if she needs contraceptive prescription.     Scheryl Darter, MD    JA/MEDQ  D:  04/14/2009  T:   04/14/2009  Job:  [id number]

## 2011-04-16 NOTE — Group Therapy Note (Signed)
NAMEANNELLE, BEHRENDT           ACCOUNT NO.:  [id number]   MEDICAL RECORD NO.:  [id number]          PATIENT TYPE:  WOC   LOCATION:  [facility]                   FACILITY:  [facility]   PHYSICIAN:  Ginger Carne, MD DATE OF BIRTH:  1981-11-07   DATE OF SERVICE:  04/01/2008                                  CLINIC NOTE   HISTORY OF PRESENT ILLNESS:  Ms. Regan today is here for a repeat  six-month Pap smear.  In October 2008 she had a colposcopy a low-grade  squamous intraepithelial lesion, CIN 1.  This was of preceded by a Pap  smear demonstrating low-grade squamous intraepithelial lesion.  The  first of her two six-month Pap smears.  I advised her that if everything  is all right we will repeat one more Pap smear in October and if normal  she can go back to yearly examinations.   PHYSICAL EXAMINATION:  EXTERNAL GENITALIA:  Vulva and vagina were  normal.  CERVIX:  Smooth without erosions or lesions, and Pap smear  performed.           ______________________________  Ginger Carne, MD     SHB/MEDQ  D:  04/01/2008  T:  04/01/2008  Job:  [id number]

## 2011-04-19 NOTE — Op Note (Signed)
[name], Nicole Whitehead                     ACCOUNT NO.:  [id number]   MEDICAL RECORD NO.:  [id number]                   PATIENT TYPE:  AMB   LOCATION:  [facility]                                  FACILITY:  [facility]   PHYSICIAN:  Gita Kudo, M.D.              DATE OF BIRTH:  31-Jul-1981   DATE OF PROCEDURE:  06/11/2004  DATE OF DISCHARGE:                                 OPERATIVE REPORT   PREOPERATIVE DIAGNOSIS:  Mass, left knee.   POSTOPERATIVE DIAGNOSIS:  Mass, left knee, probable sebaceous cyst, sent for  pathology.   OPERATION PERFORMED:  Excision cystic mass, left knee.   SURGEON:  Gita Kudo, M.D.   ANESTHESIA:  1% Xylocaine with epinephrine.   INDICATIONS FOR PROCEDURE:  The patient is a 30 year old college graduate  with a four-year history of mass.   OPERATIVE FINDINGS:  There was a firm mass that felt like a sebaceous cyst.  It was not adherent to the underlying structures and was excised easily.   DESCRIPTION OF PROCEDURE:  The patient was positioned, prepped and draped in  a standard fashion. 1% Xylocaine with epinephrine was infiltrated during the  procedure and before for good analgesia.  A small elliptical incision made  in the skin in a longitudinal direction, approximately 4 cm in size.  Then  the lesion was dissected from the subcutaneous tissue and removed with a  small rim of surrounding normal fatty tissue.  The wound was hemostatic,  closed with four #3-0 nylon mattress sutures and a sterile absorbent  pressure dressing applied.  Specimen sent for pathology.  Written  instructions given to the patient.  There were no complications.                                               Gita Kudo, M.D.    MRL/MEDQ  D:  06/11/2004  T:  06/11/2004  Job:  [id number]

## 2011-04-22 ENCOUNTER — Other Ambulatory Visit: Payer: Self-pay | Admitting: Obstetrics & Gynecology

## 2011-04-22 ENCOUNTER — Ambulatory Visit (INDEPENDENT_AMBULATORY_CARE_PROVIDER_SITE_OTHER): Payer: Managed Care, Other (non HMO) | Admitting: Obstetrics & Gynecology

## 2011-04-22 DIAGNOSIS — Z01419 Encounter for gynecological examination (general) (routine) without abnormal findings: Secondary | ICD-10-CM

## 2011-04-23 NOTE — Group Therapy Note (Signed)
NAMEDENNISHA, MOUSER           ACCOUNT NO.:  [id number]  MEDICAL RECORD NO.:  [id number]           PATIENT TYPE:  A  LOCATION:  [facility]                   FACILITY:  [facility]  PHYSICIAN:  Allie Bossier, MD        DATE OF BIRTH:  [birth year]  DATE OF SERVICE:  04/22/2011                                 CLINIC NOTE  Ms. Aprea is a 30 year old, single, African American, gravida 0, who comes in here for annual exam.  She has no GYN complaints.  She has begun having a monogamous relationship for the last approximately 6 months and that they use condoms.  She is happy with condoms at this time.  PAST MEDICAL HISTORY:  She has an elevated BMI.  She tells me that in 2005 she had a history of a sarcoma near her left knee.  She declined chemotherapy.  She also has a history of low-grade dysplasia, Pap, with a CIN-1 on biopsy in October 2008.  Her Pap smear since then have all been normal.  REVIEW OF SYSTEMS:  She works at [employer] in [employer] and travels [place].  She denies dyspareunia.  Her periods are monthly and last approximately 3 days.  The remainder of review of systems questions are negative.  She had a mammogram at the time of her sarcoma diagnosis in 2005 and it was normal.  PAST SURGICAL HISTORY:  Excision of her sarcoma, bilateral hammertoe correction, wisdom tooth extraction.  FAMILY HISTORY:  Positive for breast cancer in her paternal aunt who died in her 30s.  She denies family history of GYN or colon malignancies.  SOCIAL HISTORY:  Negative for tobacco, alcohol, or drug use.  No latex allergies.  DRUG ALLERGIES:  SEPTRA, SULFA, and VICODIN cause nausea and rash.  MEDICATIONS:  She takes probiotics and vitamin.  PHYSICAL EXAM:  GENERAL:  Well-nourished, well-hydrated, pleasant, African American female. VITAL SIGNS:  Height 5 feet 7.5 inches, weight 192 pounds, blood pressure 135/85, pulse 85. HEENT:  Normal. HEART:  Regular rate and  rhythm. LUNGS:  Clear sensation bilaterally. BREASTS:  Dense breasts bilaterally.  No nipple discharge, skin changes, or masses. ABDOMEN:  Benign. EXTERNAL GENITALIA:  No lesions.  Cervix nulliparous.  No abnormal discharge.  Bimanual exam reveals a small mobile midplane uterus.  Her adnexa are nontender.  No masses.  ASSESSMENT AND PLAN: 1. Annual exam.  I have checked Pap smear.  Recommend self-breast and     self-vulvar exam. 2. We will see her in a year or sooner as necessary.     Allie Bossier, MD    MCD/MEDQ  D:  04/22/2011  T:  04/23/2011  Job:  [id number]

## 2011-09-11 LAB — POCT PREGNANCY, URINE: Preg Test, Ur: NEGATIVE

## 2011-09-13 ENCOUNTER — Ambulatory Visit (INDEPENDENT_AMBULATORY_CARE_PROVIDER_SITE_OTHER): Payer: Managed Care, Other (non HMO) | Admitting: *Deleted

## 2011-09-13 DIAGNOSIS — Z23 Encounter for immunization: Secondary | ICD-10-CM

## 2011-09-16 ENCOUNTER — Encounter: Payer: Self-pay | Admitting: Endocrinology

## 2011-09-16 ENCOUNTER — Ambulatory Visit (INDEPENDENT_AMBULATORY_CARE_PROVIDER_SITE_OTHER): Payer: Managed Care, Other (non HMO) | Admitting: Endocrinology

## 2011-09-16 VITALS — BP 126/82 | HR 67 | Temp 98.2°F | Ht 67.0 in | Wt 207.8 lb

## 2011-09-16 DIAGNOSIS — M25579 Pain in unspecified ankle and joints of unspecified foot: Secondary | ICD-10-CM

## 2011-09-16 DIAGNOSIS — M25572 Pain in left ankle and joints of left foot: Secondary | ICD-10-CM

## 2011-09-16 NOTE — Patient Instructions (Signed)
Refer to a specialist.  you will receive a phone call, about a day and time for an appointment. You will get better much faster if you elevate your left foot above the rest of your body.

## 2011-09-16 NOTE — Progress Notes (Signed)
  Subjective:    Patient ID: Nicole Whitehead, female    DOB: 03/11/1981, 30 y.o.   MRN: [id number]  HPI Pt states a few weeks of slight swelling of the left ankle and foot, and assoc pain.   Past Medical History  Diagnosis Date  . SCOLIOSIS, MILD 12/22/2009  . PAP SMEAR, ABNORMAL 07/17/2007  . Allergy   . S/P foot surgery   . ALLERGIC RHINITIS 07/28/2007  . CELLULITIS, THIGH, RIGHT 02/11/2011  . Eustachian tube dysfunction 1999  . Conjunctivitis 2001  . Folliculitis 2000  . Labial lesion 2000  . 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 No past surgeries   . Knee surgery   . Foot surgery   . Colposcopy 03/10/06    History   Social History  . Marital Status: Single    Spouse Name: N/A    Number of Children: N/A  . Years of Education: N/A   Occupational History  . Not on file.   Social History Main Topics  . Smoking status: Never Smoker   . Smokeless tobacco: Never Used   Comment: Single, lives alone with her dog. Works at [employer] [place]  . Alcohol Use: No  . Drug Use: No  . Sexually Active:    Other Topics Concern  . Not on file   Social History Narrative  . No narrative on file    No current outpatient prescriptions on file prior to visit.    Allergies  Allergen Reactions  . Hydrocodone-Acetaminophen   . Oxycodone-Acetaminophen   . Sulfamethoxazole W/Trimethoprim     REACTION: Not Listed  . Sulfonamide Derivatives     REACTION: Not Listed    Family History  Problem Relation Age of Onset  . Breast cancer Paternal Aunt   . Cancer Paternal Aunt 50    breast ca    BP 126/82  Pulse 67  Temp(Src) 98.2 F (36.8 C) (Oral)  Ht 5\' 7"  (1.702 m)  Wt 207 lb 12 oz (94.235 kg)  BMI 32.54 kg/m2  SpO2 97%  LMP 09/10/2011    Review of Systems denies sob and chest pain.    Objective:   Physical Exam VITAL SIGNS:  See vs page GENERAL: no distress Left calf: no swelling or  tend Left med malleolus: tender and swollen.   Left foot: slightly tender at the mid plantar aspect.   Pulses: left dorsalis pedis is intact.         Assessment & Plan:  Left ankle pain/swelling, uncertain etiology

## 2012-03-11 ENCOUNTER — Encounter: Payer: Self-pay | Admitting: Internal Medicine

## 2012-03-11 ENCOUNTER — Ambulatory Visit (INDEPENDENT_AMBULATORY_CARE_PROVIDER_SITE_OTHER): Payer: Managed Care, Other (non HMO) | Admitting: Internal Medicine

## 2012-03-11 ENCOUNTER — Other Ambulatory Visit (INDEPENDENT_AMBULATORY_CARE_PROVIDER_SITE_OTHER): Payer: Managed Care, Other (non HMO)

## 2012-03-11 VITALS — BP 118/80 | HR 73 | Temp 98.4°F | Ht 67.0 in | Wt 214.0 lb

## 2012-03-11 DIAGNOSIS — Z0000 Encounter for general adult medical examination without abnormal findings: Secondary | ICD-10-CM

## 2012-03-11 DIAGNOSIS — L910 Hypertrophic scar: Secondary | ICD-10-CM

## 2012-03-11 DIAGNOSIS — E669 Obesity, unspecified: Secondary | ICD-10-CM

## 2012-03-11 LAB — URINALYSIS, ROUTINE W REFLEX MICROSCOPIC
Nitrite: NEGATIVE
Specific Gravity, Urine: >=1.03 (ref 1.000–1.030)
Total Protein, Urine: 30
Urine Glucose: NEGATIVE
pH: 6 (ref 5.0–8.0)

## 2012-03-11 LAB — LIPID PANEL
Cholesterol: 185 mg/dL (ref 0–200)
LDL Cholesterol: 107 mg/dL — ABNORMAL HIGH (ref 0–99)
Triglycerides: 60 mg/dL (ref 0.0–149.0)

## 2012-03-11 LAB — CBC WITH DIFFERENTIAL/PLATELET
Basophils Relative: 0.9 % (ref 0.0–3.0)
Eosinophils Relative: 1.6 % (ref 0.0–5.0)
Lymphocytes Relative: 35.7 % (ref 12.0–46.0)
MCV: 85 fl (ref 78.0–100.0)
Monocytes Relative: 6.9 % (ref 3.0–12.0)
Neutrophils Relative %: 54.9 % (ref 43.0–77.0)
RBC: 4.98 Mil/uL (ref 3.87–5.11)
WBC: 5.9 10*3/uL (ref 4.5–10.5)

## 2012-03-11 LAB — HEPATIC FUNCTION PANEL
Albumin: 4.1 g/dL (ref 3.5–5.2)
Alkaline Phosphatase: 50 U/L (ref 39–117)
Total Protein: 7.3 g/dL (ref 6.0–8.3)

## 2012-03-11 LAB — BASIC METABOLIC PANEL
Calcium: 9.3 mg/dL (ref 8.4–10.5)
Chloride: 111 mEq/L (ref 96–112)
Creatinine, Ser: 0.8 mg/dL (ref 0.4–1.2)
GFR: 109.57 mL/min (ref >60.00)

## 2012-03-11 MED ORDER — PHENTERMINE HCL 37.5 MG PO CAPS: 37.5000 mg | ORAL_CAPSULE | ORAL | Status: DC

## 2012-03-11 NOTE — Progress Notes (Signed)
Subjective:    Patient ID: Nicole Whitehead, female    DOB: 14-Aug-1981, 31 y.o.   MRN: [id number]  HPI patient is here today for annual physical. Patient feels well overall.  Also concerned with irritation over L thigh scar - ?recurrent infection 02/11/11 for lesion on right thigh - MRSA abscess Completed 2 weeks doxy - no fever or drainage, wound closed Residual keloid scar - irritated 2 days ago - bleeding but no redness, swelling, pain or drainage  Concerned about weight gain - ?med help  Past Medical History  Diagnosis Date  . SCOLIOSIS, MILD   . PAP SMEAR, ABNORMAL 2008  . ALLERGIC RHINITIS 07/28/2007  . CELLULITIS, THIGH, RIGHT 02/11/2011    MRSA  . Eustachian tube dysfunction 1999  . Conjunctivitis 2001  . Folliculitis 2000  . Labial lesion 2000  . 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 Breast cancer Paternal Aunt   . Cancer Paternal Aunt 52    breast ca   History  Substance Use Topics  . Smoking status: Never Smoker   . Smokeless tobacco: Never Used   Comment: Single, lives alone with her dog. Works at [employer] [place]  . Alcohol Use: No    Review of Systems Constitutional: Negative for fever - reviewed unintended weight change.  Respiratory: Negative for cough and shortness of breath.   Cardiovascular: Negative for chest pain or palpitations.  Gastrointestinal: Negative for abdominal pain, no bowel changes.  Musculoskeletal: Negative for gait problem or joint swelling.  Skin: Negative for rash.  Neurological: Negative for dizziness or headache.  No other specific complaints in a complete review of systems (except as listed in HPI above).      Objective:   Physical Exam BP 118/80  Pulse 73  Temp(Src) 98.4 F (36.9 C) (Oral)  Ht 5\' 7"  (1.702 m)  Wt 214 lb (97.07 kg)  BMI 33.52 kg/m2  SpO2 98%   Wt Readings from Last 3 Encounters:  03/11/12 214 lb (97.07 kg)  09/16/11 207  lb 12 oz (94.235 kg)  03/04/11 192 lb (87.091 kg)   Constitutional: She is overweight, but appears well-developed and well-nourished. No distress.  HENT: Head: Normocephalic and atraumatic. Ears: B TMs ok, no erythema or effusion; Nose: Nose normal. Mouth/Throat: Oropharynx is clear and moist. No oropharyngeal exudate.  Eyes: Conjunctivae and EOM are normal. Pupils are equal, round, and reactive to light. No scleral icterus.  Neck: Normal range of motion. Neck supple. No JVD present. No thyromegaly present.  Cardiovascular: Normal rate, regular rhythm and normal heart sounds.  No murmur heard. No BLE edema. Pulmonary/Chest: Effort normal and breath sounds normal. No respiratory distress. She has no wheezes.  Abdominal: Soft. Bowel sounds are normal. She exhibits no distension. There is no tenderness. no masses Musculoskeletal: Normal range of motion, no joint effusions. No gross deformities Neurological: She is alert and oriented to person, place, and time. No cranial nerve deficit. Coordination normal.  Skin: Right lateral thigh with small keloid prior scar from I&D, at underwear leg line - tiny abrasion with scab, no fluctuance, cellulitis or erythema. Skin is warm and dry. No rash noted. No erythema.  Psychiatric: She has a normal mood and affect. Her behavior is normal. Judgment and thought content normal.   Lab Results  Component Value Date   WBC 7.2 02/11/2011   HGB 13.4 02/11/2011   HCT 37.2 02/11/2011  PLT 273 02/11/2011   GLUCOSE 81 01/11/2011   CHOL 180 01/11/2011   TRIG 34.0 01/11/2011   HDL 71.30 01/11/2011   LDLCALC 102* 01/11/2011   ALT 19 01/11/2011   AST 18 01/11/2011   NA 138 01/11/2011   K 4.4 01/11/2011   CL 103 01/11/2011   CREATININE 0.9 01/11/2011   BUN 9 01/11/2011   CO2 30 01/11/2011   TSH 0.87 01/11/2011     Assessment & Plan:  CPX - v70.0 - Patient has been counseled on age-appropriate routine health concerns for screening and prevention. These are reviewed and  up-to-date. Immunizations are up-to-date or declined. Labs ordered and will be reviewed.   Keloid, right lateral thigh - irritation from underwear band - exac by weight gain - mild abrasion without evidence recurrent infection - reassurance provided - avoid elastic band irritants and call if red or worsening symptoms   Overweight - check labs as above - continue efforts at exercise and weight loss as ongoing with trainer and gym membership. 30 days phentermine prescribed as appetite suppressant. Patient will call if refills are needed= we reviewed potential risk/benefit and possible side effects - pt understands and agrees to same

## 2012-03-11 NOTE — Patient Instructions (Signed)
It was good to see you today. Your keloid is irritated, but no evidence of recurrent infection. Avoid elastic leg band and other irritation to this site, use moisturizing creams and call if redness or drainage becomes worse Work on lifestyle changes as discussed (low fat, low carb, increased protein diet; improved exercise efforts; weight loss) to control sugar, blood pressure and cholesterol levels and/or reduce risk of developing other medical problems. Look into [employer].cy or other type of food journal to assist you in this process. Okay to try phentermine daily for next 30 days as appetite suppressant prescription provided to you today for same.- call if refills are desired, no more than 3 consecutive months each year will be prescribed Test(s) ordered today. Your results will be called to you after review (48-72hours after test completion). If any changes need to be made, you will be notified at that time. Health Maintenance reviewed - all recommended immunizations and age appropriate screening are up to date.  Please schedule followup in 1 year for medical physical and labs, call sooner if problems.

## 2012-04-10 ENCOUNTER — Encounter (HOSPITAL_COMMUNITY): Payer: Self-pay | Admitting: Emergency Medicine

## 2012-04-10 ENCOUNTER — Emergency Department (INDEPENDENT_AMBULATORY_CARE_PROVIDER_SITE_OTHER)
Admission: EM | Admit: 2012-04-10 | Discharge: 2012-04-10 | Disposition: A | Discharge status: Home / Self Care | Payer: Self-pay | Source: Home / Self Care | Attending: Family Medicine | Admitting: Family Medicine

## 2012-04-10 DIAGNOSIS — T888XXA Other specified complications of surgical and medical care, not elsewhere classified, initial encounter: Secondary | ICD-10-CM

## 2012-04-10 DIAGNOSIS — T887XXA Unspecified adverse effect of drug or medicament, initial encounter: Secondary | ICD-10-CM

## 2012-04-10 LAB — POCT I-STAT, CHEM 8
Calcium, Ion: 1.23 mmol/L (ref 1.12–1.32)
Creatinine, Ser: 1 mg/dL (ref 0.50–1.10)
Hemoglobin: 15.3 g/dL — ABNORMAL HIGH (ref 12.0–15.0)
Sodium: 140 mEq/L (ref 135–145)
TCO2: 26 mmol/L (ref 0–100)

## 2012-04-10 NOTE — ED Provider Notes (Signed)
History     CSN: [id number]  Arrival date & time 04/10/12  0818   First MD Initiated Contact with Patient 04/10/12 [phone]      Chief Complaint  Patient presents with  . Medication Reaction  . Headache    (Consider location/radiation/quality/duration/timing/severity/associated sxs/prior treatment) Patient is a 31 y.o. female presenting with weakness. The history is provided by the patient and a parent.  Weakness The primary symptoms include dizziness, paresthesias and nausea. Primary symptoms do not include focal weakness, loss of sensation, speech change, memory loss or fever. Primary symptoms comment: onset with taking wt loss med--phenterimine  Dizziness also occurs with nausea and weakness.  Additional symptoms include weakness, taste disturbance and anxiety.    Past Medical History  Diagnosis Date  . SCOLIOSIS, MILD   . PAP SMEAR, ABNORMAL 2008  . ALLERGIC RHINITIS 07/28/2007  . CELLULITIS, THIGH, RIGHT 02/11/2011    MRSA  . Eustachian tube dysfunction 1999  . Conjunctivitis 2001  . Folliculitis 2000  . Labial lesion 2000  . 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 No past surgeries   . Knee surgery   . Foot surgery   . Colposcopy 03/10/06    Family History  Problem Relation Age of Onset  . Breast cancer Paternal Aunt   . Cancer Paternal Aunt 45    breast ca    History  Substance Use Topics  . Smoking status: Never Smoker   . Smokeless tobacco: Never Used   Comment: Single, lives alone with her dog. Works at [employer] [place]  . Alcohol Use: No    OB History    Grav Para Term Preterm Abortions TAB SAB Ect Mult Living                  Review of Systems  Constitutional: Positive for activity change and appetite change. Negative for fever.       Not eating well and not exercising as per usual.  Gastrointestinal: Positive for nausea.  Neurological: Positive for dizziness, weakness and  paresthesias. Negative for speech change and focal weakness.  Psychiatric/Behavioral: Negative for memory loss.    Allergies  Hydrocodone-acetaminophen; Oxycodone-acetaminophen; Sulfamethoxazole w-trimethoprim; and Sulfonamide derivatives  Home Medications   Current Outpatient Rx  Name Route Sig Dispense Refill  . PHENTERMINE HCL 37.5 MG PO CAPS Oral Take 1 capsule (37.5 mg total) by mouth every morning. 30 capsule 0    BP 140/82  Pulse 85  Temp(Src) 99.5 F (37.5 C) (Oral)  Resp 16  SpO2 100%  LMP 04/10/2012  Physical Exam  Nursing note and vitals reviewed. Constitutional: She is oriented to person, place, and time. She appears well-developed and well-nourished.  HENT:  Head: Normocephalic.  Right Ear: External ear normal.  Left Ear: External ear normal.  Mouth/Throat: Oropharynx is clear and moist.  Eyes: Conjunctivae and EOM are normal. Pupils are equal, round, and reactive to light.  Neck: Normal range of motion. Neck supple.  Cardiovascular: Normal rate, regular rhythm, normal heart sounds and intact distal pulses.   Pulmonary/Chest: Effort normal and breath sounds normal.  Lymphadenopathy:    She has no cervical adenopathy.  Neurological: She is alert and oriented to person, place, and time.  Skin: Skin is warm and dry.    ED Course  Procedures (including critical care time)  Labs Reviewed  POCT I-STAT, CHEM 8 - Abnormal; Notable for the following:  Glucose, Bld 105 (*)    Hemoglobin 15.3 (*)    All other components within normal limits   No results found.   1. Non-dose-related adverse reaction to medication, initial encounter       MDM  Labs reviewed        Linna Hoff, MD 04/10/12 [phone]

## 2012-04-10 NOTE — Discharge Instructions (Signed)
Drink plenty of fluids, no more phentermine, return as needed.

## 2012-04-10 NOTE — ED Notes (Signed)
PT HERE WITH SUDDEN ONSET OF RIGHT SIDED THROB H/A RADIATING DOWN TO R ARM NUMB/TINGLING AFTER TAKING PRESCRIBED PHENTERMINE BY PCP.PT STATES SHE TOOK MED FOR 2 WEEKS WHEN NOTICED SX AND AMMONIA SMELL IN URINE THEN STOPPED BUT RESTARTED 5/7 AND SX RESTARTED.

## 2012-05-11 ENCOUNTER — Ambulatory Visit (INDEPENDENT_AMBULATORY_CARE_PROVIDER_SITE_OTHER): Payer: Self-pay | Admitting: Physician Assistant

## 2012-05-11 ENCOUNTER — Telehealth: Payer: Self-pay | Admitting: Physician Assistant

## 2012-05-11 ENCOUNTER — Encounter: Payer: Self-pay | Admitting: Physician Assistant

## 2012-05-11 VITALS — BP 143/90 | HR 83 | Temp 98.1°F | Ht 67.5 in | Wt 204.3 lb

## 2012-05-11 DIAGNOSIS — Z01419 Encounter for gynecological examination (general) (routine) without abnormal findings: Secondary | ICD-10-CM

## 2012-05-11 NOTE — Telephone Encounter (Signed)
Patient called and stated she was running a little behind. I informed patient she had 20 minutes, then she would have to reschedule.

## 2012-05-28 ENCOUNTER — Ambulatory Visit (INDEPENDENT_AMBULATORY_CARE_PROVIDER_SITE_OTHER): Payer: Self-pay | Admitting: Internal Medicine

## 2012-05-28 ENCOUNTER — Encounter: Payer: Self-pay | Admitting: Internal Medicine

## 2012-05-28 VITALS — BP 106/72 | HR 83 | Temp 98.1°F | Resp 14 | Wt 204.5 lb

## 2012-05-28 DIAGNOSIS — H9209 Otalgia, unspecified ear: Secondary | ICD-10-CM

## 2012-05-28 DIAGNOSIS — H9201 Otalgia, right ear: Secondary | ICD-10-CM

## 2012-05-28 MED ORDER — ANTIPYRINE-BENZOCAINE 5.4-1.4 % OT SOLN: 3.0000 [drp] | OTIC | Status: AC | PRN

## 2012-05-28 NOTE — Progress Notes (Signed)
  Subjective:    Patient ID: Nicole Whitehead, female    DOB: 07-04-1981, 31 y.o.   MRN: [id number]  HPI  complains of R ear pain ?insect bite or retained in ear Onset 2 days ago  No longer using phentermine as begun 03/2012 - working out/exercise for healthy lifestyle  Past Medical History  Diagnosis Date  . SCOLIOSIS, MILD   . PAP SMEAR, ABNORMAL 2008  . ALLERGIC RHINITIS   . CELLULITIS, THIGH, RIGHT 02/11/2011    MRSA  . Eustachian tube dysfunction   . Labial lesion 2000  . 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  HENT: Negative for hearing loss, congestion, facial swelling, neck pain, dental problem, sinus pressure, tinnitus and ear discharge.   Neurological: Negative for headaches.       Objective:   Physical Exam BP 106/72  Pulse 83  Temp 98.1 F (36.7 C) (Oral)  Resp 14  Wt 204 lb 8 oz (92.761 kg)  SpO2 95%  LMP 05/05/2012 Wt Readings from Last 3 Encounters:  05/28/12 204 lb 8 oz (92.761 kg)  05/11/12 204 lb 4.8 oz (92.67 kg)  03/11/12 214 lb (97.07 kg)   Constitutional: She appears well-developed and well-nourished. No distress.  HENT: Head: Normocephalic and atraumatic. Ears: B TMs ok, no erythema or effusion; Nose: Nose normal. Mouth/Throat: Oropharynx is clear and moist. No oropharyngeal exudate.  Eyes: Conjunctivae and EOM are normal. Pupils are equal, round, and reactive to light. No scleral icterus.  Neck: Normal range of motion. Neck supple. No JVD present. No thyromegaly present.  Skin: Skin is warm and dry. No rash noted. No erythema.  Psychiatric: She has a normal mood and affect. Her behavior is normal. Judgment and thought content normal.         Assessment & Plan:  R ear discomfort - exam benign without infection - ?possible insect sting Auralgan erx done Reassurance provided

## 2012-05-28 NOTE — Patient Instructions (Signed)
It was good to see you today. Ear drops as needed - Your prescription(s) have been submitted to your pharmacy. Please take as directed and contact our office if you believe you are having problem(s) with the medication(s). Good job on the workouts - keep it going!

## 2012-09-08 ENCOUNTER — Ambulatory Visit (INDEPENDENT_AMBULATORY_CARE_PROVIDER_SITE_OTHER): Payer: Self-pay | Admitting: *Deleted

## 2012-09-08 DIAGNOSIS — Z23 Encounter for immunization: Secondary | ICD-10-CM

## 2012-09-23 ENCOUNTER — Encounter (HOSPITAL_COMMUNITY): Payer: Self-pay

## 2012-09-23 ENCOUNTER — Emergency Department (INDEPENDENT_AMBULATORY_CARE_PROVIDER_SITE_OTHER)
Admission: EM | Admit: 2012-09-23 | Discharge: 2012-09-23 | Disposition: A | Discharge status: Home / Self Care | Payer: Self-pay | Source: Home / Self Care | Attending: Emergency Medicine | Admitting: Emergency Medicine

## 2012-09-23 DIAGNOSIS — S239XXA Sprain of unspecified parts of thorax, initial encounter: Secondary | ICD-10-CM

## 2012-09-23 DIAGNOSIS — IMO0002 Reserved for concepts with insufficient information to code with codable children: Secondary | ICD-10-CM

## 2012-09-23 MED ORDER — CYCLOBENZAPRINE HCL 10 MG PO TABS: 10.0000 mg | ORAL_TABLET | Freq: Two times a day (BID) | ORAL | Status: DC | PRN

## 2012-09-23 MED ORDER — MELOXICAM 7.5 MG PO TABS: 7.5000 mg | ORAL_TABLET | Freq: Every day | ORAL | Status: AC
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
## 2013-01-04 ENCOUNTER — Encounter: Payer: Self-pay | Admitting: Physician Assistant

## 2013-01-04 NOTE — Progress Notes (Signed)
Patient has recent normal pap smears, last one in 04/2011.  Declined pap smear today due to cost.  Patient will be referred to free cervical cancer screenings.

## 2013-01-06 ENCOUNTER — Telehealth: Payer: Self-pay | Admitting: *Deleted

## 2013-01-06 DIAGNOSIS — Z0000 Encounter for general adult medical examination without abnormal findings: Secondary | ICD-10-CM

## 2013-01-06 NOTE — Telephone Encounter (Signed)
Message copied by Deatra James on Wed Jan 06, 2013  9:23 AM ------      Message from: Etheleen Sia      Created: Wed Jan 06, 2013  9:21 AM      Regarding: LAB       PHYSICAL LABS FOR FEB 19

## 2013-01-20 ENCOUNTER — Ambulatory Visit (INDEPENDENT_AMBULATORY_CARE_PROVIDER_SITE_OTHER): Payer: Self-pay | Admitting: Internal Medicine

## 2013-01-20 ENCOUNTER — Encounter: Payer: Self-pay | Admitting: Internal Medicine

## 2013-01-20 ENCOUNTER — Other Ambulatory Visit (INDEPENDENT_AMBULATORY_CARE_PROVIDER_SITE_OTHER): Payer: Self-pay

## 2013-01-20 VITALS — BP 120/84 | HR 72 | Temp 98.1°F | Wt 200.8 lb

## 2013-01-20 LAB — URINALYSIS, ROUTINE W REFLEX MICROSCOPIC
Ketones, ur: 15
Nitrite: NEGATIVE
Specific Gravity, Urine: >=1.03 (ref 1.000–1.030)
pH: 6 (ref 5.0–8.0)

## 2013-01-20 LAB — CBC WITH DIFFERENTIAL/PLATELET
Eosinophils Relative: 0.9 % (ref 0.0–5.0)
HCT: 44.7 % (ref 36.0–46.0)
Hemoglobin: 14.9 g/dL (ref 12.0–15.0)
Lymphs Abs: 2.3 10*3/uL (ref 0.7–4.0)
Monocytes Relative: 6.4 % (ref 3.0–12.0)
Platelets: 263 10*3/uL (ref 150.0–400.0)
WBC: 7.8 10*3/uL (ref 4.5–10.5)

## 2013-01-20 LAB — BASIC METABOLIC PANEL
BUN: 9 mg/dL (ref 6–23)
GFR: 101.5 mL/min (ref >60.00)
Potassium: 3.4 mEq/L — ABNORMAL LOW (ref 3.5–5.1)
Sodium: 138 mEq/L (ref 135–145)

## 2013-01-20 LAB — LIPID PANEL
Cholesterol: 163 mg/dL (ref 0–200)
Total CHOL/HDL Ratio: 2
Triglycerides: 29 mg/dL (ref 0.0–149.0)

## 2013-01-20 LAB — HEPATIC FUNCTION PANEL
Albumin: 4.1 g/dL (ref 3.5–5.2)
Bilirubin, Direct: 0.1 mg/dL (ref 0.0–0.3)
Total Protein: 7.8 g/dL (ref 6.0–8.3)

## 2013-01-20 LAB — TSH: TSH: 0.82 u[IU]/mL (ref 0.35–5.50)

## 2013-01-20 NOTE — Progress Notes (Signed)
Subjective:    Patient ID: Nicole Whitehead, female    DOB: February 06, 1981, 32 y.o.   MRN: [id number]  HPI patient is here today for annual physical. Patient feels well overall.  Past Medical History  Diagnosis Date  . SCOLIOSIS, MILD   . PAP SMEAR, ABNORMAL 2008  . ALLERGIC RHINITIS   . CELLULITIS, THIGH, RIGHT 02/11/2011    MRSA  . Eustachian tube dysfunction   . Labial lesion 2000  . 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 Breast cancer Paternal Aunt 35   History  Substance Use Topics  . Smoking status: Never Smoker   . Smokeless tobacco: Never Used     Comment: Single, lives alone with her dog. Works at [employer] [place]  . Alcohol Use: No    Review of Systems  Constitutional: Negative for fever - reviewed unintended weight change.  Respiratory: Negative for cough and shortness of breath.   Cardiovascular: Negative for chest pain or palpitations.  Gastrointestinal: Negative for abdominal pain, no bowel changes.  Musculoskeletal: Negative for gait problem or joint swelling.  Skin: Negative for rash.  Neurological: Negative for dizziness or headache.  No other specific complaints in a complete review of systems (except as listed in HPI above).      Objective:   Physical Exam  BP 120/84  Pulse 72  Temp(Src) 98.1 F (36.7 C) (Oral)  Wt 200 lb 12.8 oz (91.082 kg)  BMI 30.97 kg/m2  SpO2 98%   Wt Readings from Last 3 Encounters:  01/20/13 200 lb 12.8 oz (91.082 kg)  05/28/12 204 lb 8 oz (92.761 kg)  05/11/12 204 lb 4.8 oz (92.67 kg)   Constitutional: She is overweight, but appears well-developed and well-nourished. No distress.  HENT: Head: Normocephalic and atraumatic. Ears: B TMs ok, no erythema or effusion; Nose: Nose normal. Mouth/Throat: Oropharynx is clear and moist. No oropharyngeal exudate.  Eyes: Conjunctivae and EOM are normal. Pupils are equal, round, and reactive to light. No  scleral icterus.  Neck: Normal range of motion. Neck supple. No JVD present. No thyromegaly present.  Cardiovascular: Normal rate, regular rhythm and normal heart sounds.  No murmur heard. No BLE edema. Pulmonary/Chest: Effort normal and breath sounds normal. No respiratory distress. She has no wheezes.  Abdominal: Soft. Bowel sounds are normal. She exhibits no distension. There is no tenderness. no masses Musculoskeletal: Normal range of motion, no joint effusions. No gross deformities Neurological: She is alert and oriented to person, place, and time. No cranial nerve deficit. Coordination normal.  Skin: Skin is warm and dry. No rash noted. No erythema.  Psychiatric: She has a normal mood and affect. Her behavior is normal. Judgment and thought content normal.   Lab Results  Component Value Date   WBC 5.9 03/11/2012   HGB 15.3* 04/10/2012   HCT 45.0 04/10/2012   PLT 258.0 03/11/2012   GLUCOSE 105* 04/10/2012   CHOL 185 03/11/2012   TRIG 60.0 03/11/2012   HDL 66.40 03/11/2012   LDLCALC 107* 03/11/2012   ALT 23 03/11/2012   AST 23 03/11/2012   NA 140 04/10/2012   K 4.0 04/10/2012   CL 105 04/10/2012   CREATININE 1.00 04/10/2012   BUN 8 04/10/2012   CO2 25 03/11/2012   TSH 0.71 03/11/2012     Assessment & Plan:  CPX - v70.0 - Patient has been counseled on age-appropriate routine health concerns for screening  and prevention. These are reviewed and up-to-date. Immunizations are up-to-date or declined. Labs ordered and will be reviewed.   Overweight - weight trends reviewed. check labs as above - continue efforts at exercise and weight loss as ongoing - ineffective results with 03/2012 for 30 days phentermine trial as appetite suppressant.

## 2013-01-20 NOTE — Patient Instructions (Signed)
It was good to see you today. Health Maintenance reviewed - all recommended immunizations and age-appropriate screenings are up-to-date. Test(s) ordered today. Your results will be released to MyChart (or called to you) after review, usually within 72hours after test completion. If any changes need to be made, you will be notified at that same time. Work on lifestyle changes as discussed (low fat, low carb, increased protein diet; improved exercise efforts; weight loss) to control sugar, blood pressure and cholesterol levels and/or reduce risk of developing other medical problems. Look into [email or web address] or other type of food journal to assist you in this process. Please schedule followup in 1 year for medical physical and labs, call sooner if problems. Health Maintenance, Females A healthy lifestyle and preventative care can promote health and wellness.  Maintain regular health, dental, and eye exams.  Eat a healthy diet. Foods like vegetables, fruits, whole grains, low-fat dairy products, and lean protein foods contain the nutrients you need without too many calories. Decrease your intake of foods high in solid fats, added sugars, and salt. Get information about a proper diet from your caregiver, if necessary.  Regular physical exercise is one of the most important things you can do for your health. Most adults should get at least 150 minutes of moderate-intensity exercise (any activity that increases your heart rate and causes you to sweat) each week. In addition, most adults need muscle-strengthening exercises on 2 or more days a week.   Maintain a healthy weight. The body mass index (BMI) is a screening tool to identify possible weight problems. It provides an estimate of body fat based on height and weight. Your caregiver can help determine your BMI, and can help you achieve or maintain a healthy weight. For adults 20 years and older:  A BMI below 18.5 is considered underweight.  A BMI of  18.5 to 24.9 is normal.  A BMI of 25 to 29.9 is considered overweight.  A BMI of 30 and above is considered obese.  Maintain normal blood lipids and cholesterol by exercising and minimizing your intake of saturated fat. Eat a balanced diet with plenty of fruits and vegetables. Blood tests for lipids and cholesterol should begin at age 31 and be repeated every 5 years. If your lipid or cholesterol levels are high, you are over 50, or you are a high risk for heart disease, you may need your cholesterol levels checked more frequently.Ongoing high lipid and cholesterol levels should be treated with medicines if diet and exercise are not effective.  If you smoke, find out from your caregiver how to quit. If you do not use tobacco, do not start.  If you are pregnant, do not drink alcohol. If you are breastfeeding, be very cautious about drinking alcohol. If you are not pregnant and choose to drink alcohol, do not exceed 1 drink per day. One drink is considered to be 12 ounces (355 mL) of beer, 5 ounces (148 mL) of wine, or 1.5 ounces (44 mL) of liquor.  Avoid use of street drugs. Do not share needles with anyone. Ask for help if you need support or instructions about stopping the use of drugs.  High blood pressure causes heart disease and increases the risk of stroke. Blood pressure should be checked at least every 1 to 2 years. Ongoing high blood pressure should be treated with medicines, if weight loss and exercise are not effective.  If you are 14 to 32 years old, ask your caregiver if you should take aspirin  to prevent strokes.  Diabetes screening involves taking a blood sample to check your fasting blood sugar level. This should be done once every 3 years, after age 38, if you are within normal weight and without risk factors for diabetes. Testing should be considered at a younger age or be carried out more frequently if you are overweight and have at least 1 risk factor for diabetes.  Breast  cancer screening is essential preventative care for women. You should practice "breast self-awareness." This means understanding the normal appearance and feel of your breasts and may include breast self-examination. Any changes detected, no matter how small, should be reported to a caregiver. Women in their 32s and 30s should have a clinical breast exam (CBE) by a caregiver as part of a regular health exam every 1 to 3 years. After age 42, women should have a CBE every year. Starting at age 42, women should consider having a mammogram (breast X-ray) every year. Women who have a family history of breast cancer should talk to their caregiver about genetic screening. Women at a high risk of breast cancer should talk to their caregiver about having an MRI and a mammogram every year.  The Pap test is a screening test for cervical cancer. Women should have a Pap test starting at age 4. Between ages 27 and 66, Pap tests should be repeated every 2 years. Beginning at age 45, you should have a Pap test every 3 years as long as the past 3 Pap tests have been normal. If you had a hysterectomy for a problem that was not cancer or a condition that could lead to cancer, then you no longer need Pap tests. If you are between ages 30 and 60, and you have had normal Pap tests going back 10 years, you no longer need Pap tests. If you have had past treatment for cervical cancer or a condition that could lead to cancer, you need Pap tests and screening for cancer for at least 20 years after your treatment. If Pap tests have been discontinued, risk factors (such as a new sexual partner) need to be reassessed to determine if screening should be resumed. Some women have medical problems that increase the chance of getting cervical cancer. In these cases, your caregiver may recommend more frequent screening and Pap tests.  The human papillomavirus (HPV) test is an additional test that may be used for cervical cancer screening. The HPV  test looks for the virus that can cause the cell changes on the cervix. The cells collected during the Pap test can be tested for HPV. The HPV test could be used to screen women aged 108 years and older, and should be used in women of any age who have unclear Pap test results. After the age of 92, women should have HPV testing at the same frequency as a Pap test.  Colorectal cancer can be detected and often prevented. Most routine colorectal cancer screening begins at the age of 6 and continues through age 48. However, your caregiver may recommend screening at an earlier age if you have risk factors for colon cancer. On a yearly basis, your caregiver may provide home test kits to check for hidden blood in the stool. Use of a small camera at the end of a tube, to directly examine the colon (sigmoidoscopy or colonoscopy), can detect the earliest forms of colorectal cancer. Talk to your caregiver about this at age 77, when routine screening begins. Direct examination of the colon should  be repeated every 5 to 10 years through age 65, unless early forms of pre-cancerous polyps or small growths are found.  Hepatitis C blood testing is recommended for all people born from [birth year] through 1965 and any individual with known risks for hepatitis C.  Practice safe sex. Use condoms and avoid high-risk sexual practices to reduce the spread of sexually transmitted infections (STIs). Sexually active women aged 56 and younger should be checked for Chlamydia, which is a common sexually transmitted infection. Older women with new or multiple partners should also be tested for Chlamydia. Testing for other STIs is recommended if you are sexually active and at increased risk.  Osteoporosis is a disease in which the bones lose minerals and strength with aging. This can result in serious bone fractures. The risk of osteoporosis can be identified using a bone density scan. Women ages 98 and over and women at risk for fractures or  osteoporosis should discuss screening with their caregivers. Ask your caregiver whether you should be taking a calcium supplement or vitamin D to reduce the rate of osteoporosis.  Menopause can be associated with physical symptoms and risks. Hormone replacement therapy is available to decrease symptoms and risks. You should talk to your caregiver about whether hormone replacement therapy is right for you.  Use sunscreen with a sun protection factor (SPF) of 30 or greater. Apply sunscreen liberally and repeatedly throughout the day. You should seek shade when your shadow is shorter than you. Protect yourself by wearing long sleeves, pants, a wide-brimmed hat, and sunglasses year round, whenever you are outdoors.  Notify your caregiver of new moles or changes in moles, especially if there is a change in shape or color. Also notify your caregiver if a mole is larger than the size of a pencil eraser.  Stay current with your immunizations. Document Released: 06/03/2011 Document Revised: 02/10/2012 Document Reviewed: 06/03/2011 [facility] Patient Information 2013 [place], [place]. Exercise to Lose Weight Exercise and a healthy diet may help you lose weight. Your doctor may suggest specific exercises. EXERCISE IDEAS AND TIPS  Choose low-cost things you enjoy doing, such as walking, bicycling, or exercising to workout videos.  Take stairs instead of the elevator.  Walk during your lunch break.  Park your car further away from work or school.  Go to a gym or an exercise class.  Start with 5 to 10 minutes of exercise each day. Build up to 30 minutes of exercise 4 to 6 days a week.  Wear shoes with good support and comfortable clothes.  Stretch before and after working out.  Work out until you breathe harder and your heart beats faster.  Drink extra water when you exercise.  Do not do so much that you hurt yourself, feel dizzy, or get very short of breath. Exercises that burn about 150  calories:  Running 1  miles in 15 minutes.  Playing volleyball for 45 to 60 minutes.  Washing and waxing a car for 45 to 60 minutes.  Playing touch football for 45 minutes.  Walking 1  miles in 35 minutes.  Pushing a stroller 1  miles in 30 minutes.  Playing basketball for 30 minutes.  Raking leaves for 30 minutes.  Bicycling 5 miles in 30 minutes.  Walking 2 miles in 30 minutes.  Dancing for 30 minutes.  Shoveling snow for 15 minutes.  Swimming laps for 20 minutes.  Walking up stairs for 15 minutes.  Bicycling 4 miles in 15 minutes.  Gardening for 30 to 45  minutes.  Jumping rope for 15 minutes.  Washing windows or floors for 45 to 60 minutes. Document Released: 12/21/2010 Document Revised: 02/10/2012 Document Reviewed: 12/21/2010 [facility] Patient Information 2013 [place], [place].

## 2013-01-21 ENCOUNTER — Encounter: Payer: Self-pay | Admitting: Internal Medicine

## 2013-02-03 ENCOUNTER — Encounter: Payer: Self-pay | Admitting: Obstetrics and Gynecology

## 2013-02-03 ENCOUNTER — Ambulatory Visit (INDEPENDENT_AMBULATORY_CARE_PROVIDER_SITE_OTHER): Payer: Self-pay | Admitting: Obstetrics and Gynecology

## 2013-02-03 VITALS — BP 141/86 | HR 74 | Temp 97.2°F | Resp 20 | Ht 68.0 in | Wt 201.8 lb

## 2013-02-03 DIAGNOSIS — Z01419 Encounter for gynecological examination (general) (routine) without abnormal findings: Secondary | ICD-10-CM

## 2013-02-03 NOTE — Progress Notes (Signed)
  Subjective:     Nicole Whitehead is a 32 y.o. female and is here for a routine GYN exam and Pap The patient reports concern with watery, malodorous and at times pruritic vaginal discharge.Marland Kitchen GYN hx significiant for abnormal Pap in 2008 with cryotherapy; last Pap 04/2011 was normal. Uses condoms for contraception, but not sexually active since October.  PMH significant for left knee leiomyosarcoma FH: pat. Aunt breast Ca  History   Social History  . Marital Status: Single    Spouse Name: N/A    Number of Children: N/A  . Years of Education: N/A   Occupational History  . Not on file.   Social History Main Topics  . Smoking status: Never Smoker   . Smokeless tobacco: Never Used     Comment: Single, lives alone with her dog. worked at [employer] [place] until 12/2012  . Alcohol Use: No  . Drug Use: No  . Sexually Active: Not Currently    Birth Control/ Protection: Condom   Other Topics Concern  . Not on file   Social History Narrative  . No narrative on file   Health Maintenance  Topic Date Due  . Influenza Vaccine  08/02/2013  . Pap Smear  04/21/2014  . Tetanus/tdap  12/23/2019      Review of Systems Pertinent items are noted in HPI.   Objective:    BP 141/86  Pulse 74  Temp(Src) 97.2 F (36.2 C) (Oral)  Resp 20  Ht 5\' 8"  (1.727 m)  Wt 201 lb 12.8 oz (91.536 kg)  BMI 30.69 kg/m2  LMP 01/24/2013  BP 141/86  Pulse 74  Temp(Src) 97.2 F (36.2 C) (Oral)  Resp 20  Ht 5\' 8"  (1.727 m)  Wt 201 lb 12.8 oz (91.536 kg)  BMI 30.69 kg/m2  LMP 01/24/2013  General Appearance:    Alert, cooperative, no distress, appears stated age  Head:    Normocephalic, without obvious abnormality, atraumatic              Neck:   Supple, symmetrical, trachea midline, no adenopathy;    thyroid:  no enlargement/tenderness/nodules  Back:     Symmetric, ROM normal, no CVA tenderness  Lungs:     Clear to auscultation bilaterally, respirations unlabored      Heart:    Regular rate  and rhythm, S1 and S2 normal, no murmur, rub   or gallop  Breast Exam:    No tenderness, masses, or nipple abnormality. No lymphadenopathy BSE reviewed  Abdomen:     Soft, non-tender, bowel sounds active all four quadrants,    no masses, no organomegaly  Genitalia:    Normal female without lesion, thin gray vaginal discharge: Pap, GC/CT, WP sent     Extremities:   Extremities normal, atraumatic, no cyanosis or edema     Skin:   Skin color, texture, turgor normal, no rashes or lesions     Neurologic: Grossly intact     Assessment:    Healthy female exam. ] Hx. Abnormal Pap Vaginal discharge      Plan:  Labs sent. F/U results.  Pap yearly   See After Visit Summary for Counseling Recommendations

## 2013-02-03 NOTE — Progress Notes (Signed)
Pt reports having a clear vaginal d/c w/itching, no odor

## 2013-02-03 NOTE — Patient Instructions (Signed)
Health Maintenance, Females A healthy lifestyle and preventative care can promote health and wellness.  Maintain regular health, dental, and eye exams.  Eat a healthy diet. Foods like vegetables, fruits, whole grains, low-fat dairy products, and lean protein foods contain the nutrients you need without too many calories. Decrease your intake of foods high in solid fats, added sugars, and salt. Get information about a proper diet from your caregiver, if necessary.  Regular physical exercise is one of the most important things you can do for your health. Most adults should get at least 150 minutes of moderate-intensity exercise (any activity that increases your heart rate and causes you to sweat) each week. In addition, most adults need muscle-strengthening exercises on 2 or more days a week.   Maintain a healthy weight. The body mass index (BMI) is a screening tool to identify possible weight problems. It provides an estimate of body fat based on height and weight. Your caregiver can help determine your BMI, and can help you achieve or maintain a healthy weight. For adults 20 years and older:  A BMI below 18.5 is considered underweight.  A BMI of 18.5 to 24.9 is normal.  A BMI of 25 to 29.9 is considered overweight.  A BMI of 30 and above is considered obese.  Maintain normal blood lipids and cholesterol by exercising and minimizing your intake of saturated fat. Eat a balanced diet with plenty of fruits and vegetables. Blood tests for lipids and cholesterol should begin at age 20 and be repeated every 5 years. If your lipid or cholesterol levels are high, you are over 50, or you are a high risk for heart disease, you may need your cholesterol levels checked more frequently.Ongoing high lipid and cholesterol levels should be treated with medicines if diet and exercise are not effective.  If you smoke, find out from your caregiver how to quit. If you do not use tobacco, do not start.  If you  are pregnant, do not drink alcohol. If you are breastfeeding, be very cautious about drinking alcohol. If you are not pregnant and choose to drink alcohol, do not exceed 1 drink per day. One drink is considered to be 12 ounces (355 mL) of beer, 5 ounces (148 mL) of wine, or 1.5 ounces (44 mL) of liquor.  Avoid use of street drugs. Do not share needles with anyone. Ask for help if you need support or instructions about stopping the use of drugs.  High blood pressure causes heart disease and increases the risk of stroke. Blood pressure should be checked at least every 1 to 2 years. Ongoing high blood pressure should be treated with medicines, if weight loss and exercise are not effective.  If you are 55 to 32 years old, ask your caregiver if you should take aspirin to prevent strokes.  Diabetes screening involves taking a blood sample to check your fasting blood sugar level. This should be done once every 3 years, after age 45, if you are within normal weight and without risk factors for diabetes. Testing should be considered at a younger age or be carried out more frequently if you are overweight and have at least 1 risk factor for diabetes.  Breast cancer screening is essential preventative care for women. You should practice "breast self-awareness." This means understanding the normal appearance and feel of your breasts and may include breast self-examination. Any changes detected, no matter how small, should be reported to a caregiver. Women in their 20s and 30s should have   a clinical breast exam (CBE) by a caregiver as part of a regular health exam every 1 to 3 years. After age 40, women should have a CBE every year. Starting at age 40, women should consider having a mammogram (breast X-ray) every year. Women who have a family history of breast cancer should talk to their caregiver about genetic screening. Women at a high risk of breast cancer should talk to their caregiver about having an MRI and a  mammogram every year.  The Pap test is a screening test for cervical cancer. Women should have a Pap test starting at age 21. Between ages 21 and 29, Pap tests should be repeated every 2 years. Beginning at age 30, you should have a Pap test every 3 years as long as the past 3 Pap tests have been normal. If you had a hysterectomy for a problem that was not cancer or a condition that could lead to cancer, then you no longer need Pap tests. If you are between ages 65 and 70, and you have had normal Pap tests going back 10 years, you no longer need Pap tests. If you have had past treatment for cervical cancer or a condition that could lead to cancer, you need Pap tests and screening for cancer for at least 20 years after your treatment. If Pap tests have been discontinued, risk factors (such as a new sexual partner) need to be reassessed to determine if screening should be resumed. Some women have medical problems that increase the chance of getting cervical cancer. In these cases, your caregiver may recommend more frequent screening and Pap tests.  The human papillomavirus (HPV) test is an additional test that may be used for cervical cancer screening. The HPV test looks for the virus that can cause the cell changes on the cervix. The cells collected during the Pap test can be tested for HPV. The HPV test could be used to screen women aged 30 years and older, and should be used in women of any age who have unclear Pap test results. After the age of 30, women should have HPV testing at the same frequency as a Pap test.  Colorectal cancer can be detected and often prevented. Most routine colorectal cancer screening begins at the age of 50 and continues through age 75. However, your caregiver may recommend screening at an earlier age if you have risk factors for colon cancer. On a yearly basis, your caregiver may provide home test kits to check for hidden blood in the stool. Use of a small camera at the end of a  tube, to directly examine the colon (sigmoidoscopy or colonoscopy), can detect the earliest forms of colorectal cancer. Talk to your caregiver about this at age 50, when routine screening begins. Direct examination of the colon should be repeated every 5 to 10 years through age 75, unless early forms of pre-cancerous polyps or small growths are found.  Hepatitis C blood testing is recommended for all people born from 1945 through 1965 and any individual with known risks for hepatitis C.  Practice safe sex. Use condoms and avoid high-risk sexual practices to reduce the spread of sexually transmitted infections (STIs). Sexually active women aged 25 and younger should be checked for Chlamydia, which is a common sexually transmitted infection. Older women with new or multiple partners should also be tested for Chlamydia. Testing for other STIs is recommended if you are sexually active and at increased risk.  Osteoporosis is a disease in which the   bones lose minerals and strength with aging. This can result in serious bone fractures. The risk of osteoporosis can be identified using a bone density scan. Women ages 61 and over and women at risk for fractures or osteoporosis should discuss screening with their caregivers. Ask your caregiver whether you should be taking a calcium supplement or vitamin D to reduce the rate of osteoporosis.  Menopause can be associated with physical symptoms and risks. Hormone replacement therapy is available to decrease symptoms and risks. You should talk to your caregiver about whether hormone replacement therapy is right for you.  Use sunscreen with a sun protection factor (SPF) of 30 or greater. Apply sunscreen liberally and repeatedly throughout the day. You should seek shade when your shadow is shorter than you. Protect yourself by wearing long sleeves, pants, a wide-brimmed hat, and sunglasses year round, whenever you are outdoors.  Notify your caregiver of new moles or  changes in moles, especially if there is a change in shape or color. Also notify your caregiver if a mole is larger than the size of a pencil eraser.  Stay current with your immunizations. Document Released: 06/03/2011 Document Revised: 02/10/2012 Document Reviewed: 06/03/2011 [facility] Patient Information 2013 [place], [place]. Cancer Prevention Tips  Eat more foods that come from plants, such as fruits, vegetables, beans, nuts, and whole grains.  Eat less food that comes from animals, such as meat, cheese, and eggs.  Eat 5 servings of different fruits and vegetables every day. Eat fruits and vegetables of all colors.  Eat green foods like broccoli, lettuce, or greens.  Eat yellow-orange foods like carrots, cantaloupe, bananas, or sweet potatoes.  Eat red foods like strawberries, tomatoes, or red beans.  Eat blue or purple foods like blueberries, eggplant, or plums.  Eat white foods like garlic, potatoes, or onions.  Avoid fried food.  Eat brown rice, whole wheat bread, whole grain pasta, and cereals.  Eat less white rice, white bread, regular pasta, desserts, sweetened cereals, soft drinks, and sugars.  Eat fish, chicken, [place], or beans. Eat only a small piece of any meat. Cook meat by baking, broiling, or boiling.  Stay at a healthy weight. Lose weight if you are too heavy.  Eat less food that is high in empty calories, like [place] fries, fried chicken, pizza, doughnuts, and other sweets.  Move or exercise as much as you can.  Try walking, gardening, dancing, or bicycling to help you burn calories. Take the stairs instead of the elevator. Walk to where you are going instead of driving.  Drink less alcohol.  Men should limit alcohol to 2 drinks per day.  Women should limit alcohol to 1 drink per day.  Do not smoke or use any kind of tobacco. Check with your doctor before you make any big changes in your diet or lifestyle. Document Released: 02/14/2009 Document  Revised: 05/19/2012 Document Reviewed: 02/14/2009 [facility] Patient Information 2013 [place], [place].

## 2013-02-05 ENCOUNTER — Telehealth: Payer: Self-pay | Admitting: *Deleted

## 2013-02-05 MED ORDER — METRONIDAZOLE 500 MG PO TABS: ORAL_TABLET | ORAL | Status: DC

## 2013-02-05 NOTE — Telephone Encounter (Signed)
Pt notified of +trich infection requiring Rx treatment. Pt also informed that her partner will need treatment and may receive from his PCP or [facility] clinic- he will need to call for appt. She should not have intercourse until 1 week after her treatment and 1week after her partner has been treated. Pt states she is at [facility] and requests Rx to be called in now. I agreed and completed pt's request. Pt voiced understanding of all information given.

## 2013-02-05 NOTE — Telephone Encounter (Signed)
Message copied by Jill Side on Fri Feb 05, 2013 11:11 AM ------      Message from: Danae Orleans      Created: Fri Feb 05, 2013 10:47 AM       Pleaase call in rx for flagyl 2 gm po stat and call pt (trich) ------

## 2013-03-02 ENCOUNTER — Telehealth: Payer: Self-pay | Admitting: *Deleted

## 2013-03-02 NOTE — Telephone Encounter (Signed)
Pt left message requesting Pap results. I called pt and left message on pt's personal voice mail that her Pap was normal. She will need routine annual Gyn exam in 1 year.

## 2013-08-25 ENCOUNTER — Ambulatory Visit (INDEPENDENT_AMBULATORY_CARE_PROVIDER_SITE_OTHER): Payer: PRIVATE HEALTH INSURANCE

## 2013-08-25 DIAGNOSIS — Z23 Encounter for immunization: Secondary | ICD-10-CM

## 2014-10-06 ENCOUNTER — Ambulatory Visit: Payer: PRIVATE HEALTH INSURANCE

## 2014-10-06 ENCOUNTER — Ambulatory Visit (INDEPENDENT_AMBULATORY_CARE_PROVIDER_SITE_OTHER): Payer: PRIVATE HEALTH INSURANCE

## 2014-10-06 DIAGNOSIS — Z23 Encounter for immunization: Secondary | ICD-10-CM

## 2014-10-11 ENCOUNTER — Ambulatory Visit: Payer: PRIVATE HEALTH INSURANCE

## 2014-10-24 ENCOUNTER — Other Ambulatory Visit (INDEPENDENT_AMBULATORY_CARE_PROVIDER_SITE_OTHER): Payer: PRIVATE HEALTH INSURANCE

## 2014-10-24 ENCOUNTER — Encounter: Payer: Self-pay | Admitting: Family

## 2014-10-24 ENCOUNTER — Ambulatory Visit (INDEPENDENT_AMBULATORY_CARE_PROVIDER_SITE_OTHER): Payer: PRIVATE HEALTH INSURANCE | Admitting: Family

## 2014-10-24 VITALS — BP 160/110 | HR 72 | Temp 98.5°F | Resp 18 | Ht 68.0 in | Wt 196.0 lb

## 2014-10-24 DIAGNOSIS — Z0000 Encounter for general adult medical examination without abnormal findings: Secondary | ICD-10-CM

## 2014-10-24 LAB — CBC
HCT: 40.9 % (ref 36.0–46.0)
Hemoglobin: 13.7 g/dL (ref 12.0–15.0)
MCHC: 33.5 g/dL (ref 30.0–36.0)
MCV: 84.4 fl (ref 78.0–100.0)
Platelets: 250 10*3/uL (ref 150.0–400.0)
RBC: 4.85 Mil/uL (ref 3.87–5.11)
RDW: 14.8 % (ref 11.5–15.5)
WBC: 5.6 10*3/uL (ref 4.0–10.5)

## 2014-10-24 LAB — TSH: TSH: 1.21 u[IU]/mL (ref 0.35–4.50)

## 2014-10-24 NOTE — Patient Instructions (Signed)
Thank you for choosing [facility].  Summary/Instructions:  Please stop by the lab on the basement level of the building for your blood work. Your results will be released to [place] (or called to you) after review, usually within 72hours after test completion. If any changes need to be made, you will be notified at that same time.  Health Maintenance Adopting a healthy lifestyle and getting preventive care can go a long way to promote health and wellness. Talk with your health care provider about what schedule of regular examinations is right for you. This is a good chance for you to check in with your provider about disease prevention and staying healthy. In between checkups, there are plenty of things you can do on your own. Experts have done a lot of research about which lifestyle changes and preventive measures are most likely to keep you healthy. Ask your health care provider for more information. WEIGHT AND DIET  Eat a healthy diet  Be sure to include plenty of vegetables, fruits, low-fat dairy products, and lean protein.  Do not eat a lot of foods high in solid fats, added sugars, or salt.  Get regular exercise. This is one of the most important things you can do for your health.  Most adults should exercise for at least 150 minutes each week. The exercise should increase your heart rate and make you sweat (moderate-intensity exercise).  Most adults should also do strengthening exercises at least twice a week. This is in addition to the moderate-intensity exercise.  Maintain a healthy weight  Body mass index (BMI) is a measurement that can be used to identify possible weight problems. It estimates body fat based on height and weight. Your health care provider can help determine your BMI and help you achieve or maintain a healthy weight.  For females 8 years of age and older:   A BMI below 18.5 is considered underweight.  A BMI of 18.5 to 24.9 is normal.  A BMI of 25  to 29.9 is considered overweight.  A BMI of 30 and above is considered obese.  Watch levels of cholesterol and blood lipids  You should start having your blood tested for lipids and cholesterol at 33 years of age, then have this test every 5 years.  You may need to have your cholesterol levels checked more often if:  Your lipid or cholesterol levels are high.  You are older than 33 years of age.  You are at high risk for heart disease.  CANCER SCREENING   Lung Cancer  Lung cancer screening is recommended for adults 71-7 years old who are at high risk for lung cancer because of a history of smoking.  A yearly low-dose CT scan of the lungs is recommended for people who:  Currently smoke.  Have quit within the past 15 years.  Have at least a 30-pack-year history of smoking. A pack year is smoking an average of one pack of cigarettes a day for 1 year.  Yearly screening should continue until it has been 15 years since you quit.  Yearly screening should stop if you develop a health problem that would prevent you from having lung cancer treatment.  Breast Cancer  Practice breast self-awareness. This means understanding how your breasts normally appear and feel.  It also means doing regular breast self-exams. Let your health care provider know about any changes, no matter how small.  If you are in your 20s or 30s, you should have a clinical breast exam (  CBE) by a health care provider every 1-3 years as part of a regular health exam.  If you are 28 or older, have a CBE every year. Also consider having a breast X-ray (mammogram) every year.  If you have a family history of breast cancer, talk to your health care provider about genetic screening.  If you are at high risk for breast cancer, talk to your health care provider about having an MRI and a mammogram every year.  Breast cancer gene (BRCA) assessment is recommended for women who have family members with BRCA-related  cancers. BRCA-related cancers include:  Breast.  Ovarian.  Tubal.  Peritoneal cancers.  Results of the assessment will determine the need for genetic counseling and BRCA1 and BRCA2 testing. Cervical Cancer Routine pelvic examinations to screen for cervical cancer are no longer recommended for nonpregnant women who are considered low risk for cancer of the pelvic organs (ovaries, uterus, and vagina) and who do not have symptoms. A pelvic examination may be necessary if you have symptoms including those associated with pelvic infections. Ask your health care provider if a screening pelvic exam is right for you.   The Pap test is the screening test for cervical cancer for women who are considered at risk.  If you had a hysterectomy for a problem that was not cancer or a condition that could lead to cancer, then you no longer need Pap tests.  If you are older than 65 years, and you have had normal Pap tests for the past 10 years, you no longer need to have Pap tests.  If you have had past treatment for cervical cancer or a condition that could lead to cancer, you need Pap tests and screening for cancer for at least 20 years after your treatment.  If you no longer get a Pap test, assess your risk factors if they change (such as having a new sexual partner). This can affect whether you should start being screened again.  Some women have medical problems that increase their chance of getting cervical cancer. If this is the case for you, your health care provider may recommend more frequent screening and Pap tests.  The human papillomavirus (HPV) test is another test that may be used for cervical cancer screening. The HPV test looks for the virus that can cause cell changes in the cervix. The cells collected during the Pap test can be tested for HPV.  The HPV test can be used to screen women 106 years of age and older. Getting tested for HPV can extend the interval between normal Pap tests from  three to five years.  An HPV test also should be used to screen women of any age who have unclear Pap test results.  After 33 years of age, women should have HPV testing as often as Pap tests.  Colorectal Cancer  This type of cancer can be detected and often prevented.  Routine colorectal cancer screening usually begins at 33 years of age and continues through 33 years of age.  Your health care provider may recommend screening at an earlier age if you have risk factors for colon cancer.  Your health care provider may also recommend using home test kits to check for hidden blood in the stool.  A small camera at the end of a tube can be used to examine your colon directly (sigmoidoscopy or colonoscopy). This is done to check for the earliest forms of colorectal cancer.  Routine screening usually begins at age 72.  Direct examination of the colon should be repeated every 5-10 years through 33 years of age. However, you may need to be screened more often if early forms of precancerous polyps or small growths are found. Skin Cancer  Check your skin from head to toe regularly.  Tell your health care provider about any new moles or changes in moles, especially if there is a change in a mole's shape or color.  Also tell your health care provider if you have a mole that is larger than the size of a pencil eraser.  Always use sunscreen. Apply sunscreen liberally and repeatedly throughout the day.  Protect yourself by wearing long sleeves, pants, a wide-brimmed hat, and sunglasses whenever you are outside. HEART DISEASE, DIABETES, AND HIGH BLOOD PRESSURE   Have your blood pressure checked at least every 1-2 years. High blood pressure causes heart disease and increases the risk of stroke.  If you are between 26 years and 31 years old, ask your health care provider if you should take aspirin to prevent strokes.  Have regular diabetes screenings. This involves taking a blood sample to check  your fasting blood sugar level.  If you are at a normal weight and have a low risk for diabetes, have this test once every three years after 33 years of age.  If you are overweight and have a high risk for diabetes, consider being tested at a younger age or more often. PREVENTING INFECTION  Hepatitis B  If you have a higher risk for hepatitis B, you should be screened for this virus. You are considered at high risk for hepatitis B if:  You were born in a country where hepatitis B is common. Ask your health care provider which countries are considered high risk.  Your parents were born in a high-risk country, and you have not been immunized against hepatitis B (hepatitis B vaccine).  You have HIV or AIDS.  You use needles to inject street drugs.  You live with someone who has hepatitis B.  You have had sex with someone who has hepatitis B.  You get hemodialysis treatment.  You take certain medicines for conditions, including cancer, organ transplantation, and autoimmune conditions. Hepatitis C  Blood testing is recommended for:  Everyone born from [birth year] through 1965.  Anyone with known risk factors for hepatitis C. Sexually transmitted infections (STIs)  You should be screened for sexually transmitted infections (STIs) including gonorrhea and chlamydia if:  You are sexually active and are younger than 33 years of age.  You are older than 33 years of age and your health care provider tells you that you are at risk for this type of infection.  Your sexual activity has changed since you were last screened and you are at an increased risk for chlamydia or gonorrhea. Ask your health care provider if you are at risk.  If you do not have HIV, but are at risk, it may be recommended that you take a prescription medicine daily to prevent HIV infection. This is called pre-exposure prophylaxis (PrEP). You are considered at risk if:  You are sexually active and do not regularly use  condoms or know the HIV status of your partner(s).  You take drugs by injection.  You are sexually active with a partner who has HIV. Talk with your health care provider about whether you are at high risk of being infected with HIV. If you choose to begin PrEP, you should first be tested for HIV. You should then be  tested every 3 months for as long as you are taking PrEP.  PREGNANCY   If you are premenopausal and you may become pregnant, ask your health care provider about preconception counseling.  If you may become pregnant, take 400 to 800 micrograms (mcg) of folic acid every day.  If you want to prevent pregnancy, talk to your health care provider about birth control (contraception). OSTEOPOROSIS AND MENOPAUSE   Osteoporosis is a disease in which the bones lose minerals and strength with aging. This can result in serious bone fractures. Your risk for osteoporosis can be identified using a bone density scan.  If you are 23 years of age or older, or if you are at risk for osteoporosis and fractures, ask your health care provider if you should be screened.  Ask your health care provider whether you should take a calcium or vitamin D supplement to lower your risk for osteoporosis.  Menopause may have certain physical symptoms and risks.  Hormone replacement therapy may reduce some of these symptoms and risks. Talk to your health care provider about whether hormone replacement therapy is right for you.  HOME CARE INSTRUCTIONS   Schedule regular health, dental, and eye exams.  Stay current with your immunizations.   Do not use any tobacco products including cigarettes, chewing tobacco, or electronic cigarettes.  If you are pregnant, do not drink alcohol.  If you are breastfeeding, limit how much and how often you drink alcohol.  Limit alcohol intake to no more than 1 drink per day for nonpregnant women. One drink equals 12 ounces of beer, 5 ounces of wine, or 1 ounces of hard  liquor.  Do not use street drugs.  Do not share needles.  Ask your health care provider for help if you need support or information about quitting drugs.  Tell your health care provider if you often feel depressed.  Tell your health care provider if you have ever been abused or do not feel safe at home. Document Released: 06/03/2011 Document Revised: 04/04/2014 Document Reviewed: 10/20/2013 [facility] Patient Information 2015 [place], [place]. This information is not intended to replace advice given to you by your health care provider. Make sure you discuss any questions you have with your health care provider.

## 2014-10-24 NOTE — Progress Notes (Signed)
Pre visit review using our clinic review tool, if applicable. No additional management support is needed unless otherwise documented below in the visit note. 

## 2014-10-24 NOTE — Progress Notes (Addendum)
Subjective:    Patient ID: Nicole Whitehead, female    DOB: 1981-11-04, 33 y.o.   MRN: [id number]   Chief Complaint  Patient presents with  . CPE    Would like blood work if possible today  . Blood pressure    Retake blood pressure 138/88    HPI:  Nicole Whitehead is a 33 y.o. female who presents today for an annual wellness visit.  1) Health Maintenance - Believes health to be ok, concern about weight, but is still exercising.   Diet - Does not eat as should; can improve with some more vegetables in diet. Usually eats chicken.  Exercise - Runs every morning.  Wt Readings from Last 3 Encounters:  10/24/14 196 lb (88.905 kg)  02/03/13 201 lb 12.8 oz (91.536 kg)  01/20/13 200 lb 12.8 oz (91.082 kg)    2) Preventative Exams / Immunizations:  Dental -- Up to date Vision -- Up to date  Health Maintenance  Topic Date Due  . INFLUENZA VACCINE  07/03/2015  . PAP SMEAR  02/04/2016  . TETANUS/TDAP  12/23/2019    Immunization History  Administered Date(s) Administered  . Hpv 03/25/1926, 05/22/2006, 09/13/2006  . Influenza Split 09/13/2011, 09/08/2012  . Influenza Whole 07/03/2004  . Influenza,inj,Quad PF,36+ Mos 08/25/2013, 10/06/2014  . Td 07/11/1998, 12/22/2009   Allergies  Allergen Reactions  . Hydrocodone-Acetaminophen   . Oxycodone-Acetaminophen   . Sulfamethoxazole-Trimethoprim     REACTION: Not Listed  . Sulfonamide Derivatives     REACTION: Not Listed   Current Outpatient Prescriptions on File Prior to Visit  Medication Sig Dispense Refill  . Multiple Vitamins-Minerals (HAIR VITAMINS) TABS Take by mouth daily.     No current facility-administered medications on file prior to visit.   Family History  Problem Relation Age of Onset  . Breast cancer Paternal Aunt 70   Past Medical History  Diagnosis Date  . SCOLIOSIS, MILD   . PAP SMEAR, ABNORMAL 2008  . ALLERGIC RHINITIS   . CELLULITIS, THIGH, RIGHT 02/11/2011    MRSA  . Eustachian tube  dysfunction   . Labial lesion 2000  . 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 No past surgeries    . Knee surgery    . Foot surgery    . Colposcopy  03/10/06   History   Social History  . Marital Status: Single    Spouse Name: N/A    Number of Children: 0  . Years of Education: 16   Occupational History  . Salesperson     [employer]   Social History Main Topics  . Smoking status: Never Smoker   . Smokeless tobacco: Never Used     Comment: Single, lives alone with her dog. worked at [employer] [place] until 12/2012  . Alcohol Use: No  . Drug Use: No  . Sexual Activity: Not Currently    Birth Control/ Protection: Condom   Other Topics Concern  . Not on file   Social History Narrative   Review of Systems  Constitutional: Denies fever, chills, fatigue, or significant weight gain/loss. HENT: Head: Denies headache or neck pain Ears: Denies changes in hearing, ringing in ears, earache, drainage Nose: Denies discharge, stuffiness, itching, nosebleed, sinus pain Throat: Denies sore throat, hoarseness, dry mouth, sores, thrush Eyes: Denies loss/changes in vision, pain, blurry/double vision, flashing lights Some eye redness - using visine and improving.  Cardiovascular:  Denies chest pain/discomfort, tightness, palpitations, shortness of breath with activity, difficulty lying down, swelling, sudden awakening with shortness of breath Respiratory: Denies shortness of breath, cough, sputum production, wheezing Gastrointestinal: Denies dysphasia, heartburn, change in appetite, nausea, change in bowel habits, rectal bleeding, constipation, diarrhea, yellow skin or eyes Genitourinary: Denies frequency, urgency, burning/pain, blood in urine, incontinence, change in urinary strength. Musculoskeletal: Denies muscle/joint pain, stiffness, back pain, redness or swelling of joints, trauma Skin: Denies rashes,  lumps, itching, dryness, color changes, or hair/nail changes Neurological: Denies dizziness, fainting, seizures, weakness, numbness, tingling, tremor Psychiatric - Denies nervousness, stress, depression or memory loss Endocrine: Denies heat or cold intolerance, sweating, frequent urination, excessive thirst, changes in appetite Hematologic: Denies ease of bruising or bleeding    Objective:    BP 160/110 mmHg  Pulse 72  Temp(Src) 98.5 F (36.9 C) (Oral)  Resp 18  Ht 5\' 8"  (1.727 m)  Wt 196 lb (88.905 kg)  BMI 29.81 kg/m2  SpO2 98% Nursing note and vital signs reviewed. Retake BP 138/88 Female chaperone present for breast exam.  Physical Exam  Constitutional: She is oriented to person, place, and time. She appears well-developed and well-nourished.  HENT:  Head: Normocephalic.  Right Ear: Hearing, tympanic membrane, external ear and ear canal normal.  Left Ear: Hearing, tympanic membrane, external ear and ear canal normal.  Nose: Nose normal.  Mouth/Throat: Uvula is midline, oropharynx is clear and moist and mucous membranes are normal.  Eyes: Conjunctivae and EOM are normal. Pupils are equal, round, and reactive to light.  Neck: Neck supple. No JVD present. No tracheal deviation present. No thyromegaly present.  Cardiovascular: Normal rate, regular rhythm, normal heart sounds and intact distal pulses.   Pulmonary/Chest: Effort normal and breath sounds normal.  Abdominal: Soft. Bowel sounds are normal. She exhibits no distension and no mass. There is no tenderness. There is no rebound and no guarding.  Musculoskeletal: Normal range of motion. She exhibits no edema or tenderness.  Lymphadenopathy:    She has no cervical adenopathy.  Neurological: She is alert and oriented to person, place, and time. She has normal reflexes. No cranial nerve deficit. She exhibits normal muscle tone. Coordination normal.  Skin: Skin is warm and dry.  Psychiatric: She has a normal mood and affect. Her  behavior is normal. Judgment and thought content normal.       Assessment & Plan:

## 2014-10-24 NOTE — Assessment & Plan Note (Addendum)
1) Anticipatory Guidance: Discussed importance of wearing a seatbelt while driving and not texting while driving; changing batteries in smoke detector at least once annually; wearing suntan lotion when outside; eating a balanced and moderate diet; getting physical activity at least 30 minutes per day.  2) Immunizations / Screenings / Labs:  Immunizations and screenings are up to date. Obtain TSH, BMET, CBC and Lipid panel.  Overall well exam. Blood pressure elevation most likely to job related stress as described by patient. Continue current healthy behaviors of exercise. Increase vegetable intake. Follow up in 1 year or sooner pending lab results.

## 2014-10-25 ENCOUNTER — Other Ambulatory Visit: Payer: PRIVATE HEALTH INSURANCE

## 2014-10-25 ENCOUNTER — Encounter: Payer: Self-pay | Admitting: Family

## 2014-10-25 LAB — BASIC METABOLIC PANEL
BUN: 10 mg/dL (ref 6–23)
CALCIUM: 9.1 mg/dL (ref 8.4–10.5)
CHLORIDE: 105 meq/L (ref 96–112)
CO2: 23 mEq/L (ref 19–32)
CREATININE: 0.8 mg/dL (ref 0.4–1.2)
GFR: 110.99 mL/min (ref >60.00)
Glucose, Bld: 63 mg/dL — ABNORMAL LOW (ref 70–99)
Potassium: 3.6 mEq/L (ref 3.5–5.1)
Sodium: 139 mEq/L (ref 135–145)

## 2014-10-25 LAB — LIPID PANEL
CHOLESTEROL: 198 mg/dL (ref 0–200)
HDL: 69.5 mg/dL (ref >39.00)
LDL Cholesterol: 122 mg/dL — ABNORMAL HIGH (ref 0–99)
NonHDL: 128.5
TRIGLYCERIDES: 33 mg/dL (ref 0.0–149.0)
Total CHOL/HDL Ratio: 3
VLDL: 6.6 mg/dL (ref 0.0–40.0)

## 2014-11-30 ENCOUNTER — Ambulatory Visit: Payer: PRIVATE HEALTH INSURANCE | Admitting: Obstetrics & Gynecology

## 2015-01-06 ENCOUNTER — Telehealth: Payer: Self-pay | Admitting: Internal Medicine

## 2015-01-06 NOTE — Telephone Encounter (Signed)
Patient states that her bp was high on her last ov.  She states that Nicole Whitehead told her it was due to her job.  She is requesting the conversation to be noted in [facility] notes and sent to her.  Please follow up with patient.

## 2015-01-09 NOTE — Telephone Encounter (Signed)
Patient called again looking for the OV notes.

## 2015-01-09 NOTE — Telephone Encounter (Signed)
Printed and available for pickup.  

## 2015-01-09 NOTE — Telephone Encounter (Signed)
Pt aware will pick it up tomorrow.

## 2015-03-09 ENCOUNTER — Encounter: Payer: Self-pay | Admitting: Internal Medicine

## 2015-03-09 ENCOUNTER — Ambulatory Visit: Payer: PRIVATE HEALTH INSURANCE | Admitting: Internal Medicine

## 2015-03-09 DIAGNOSIS — R4689 Other symptoms and signs involving appearance and behavior: Secondary | ICD-10-CM | POA: Insufficient documentation

## 2015-08-21 ENCOUNTER — Emergency Department (HOSPITAL_COMMUNITY)
Admission: EM | Admit: 2015-08-21 | Discharge: 2015-08-21 | Disposition: A | Discharge status: Home / Self Care | Payer: Self-pay | Source: Home / Self Care | Attending: Family Medicine | Admitting: Family Medicine

## 2015-08-21 NOTE — ED Notes (Signed)
Spoke with Nicole Whitehead, emt about drug screen Patient need drug screen test with her workers comp claim Patient was advised to go to [facility]  Patient understands and will go

## 2015-08-25 ENCOUNTER — Other Ambulatory Visit: Payer: Self-pay | Admitting: Occupational Medicine

## 2015-08-25 ENCOUNTER — Ambulatory Visit: Payer: Self-pay

## 2015-08-25 DIAGNOSIS — M542 Cervicalgia: Secondary | ICD-10-CM

## 2016-12-29 ENCOUNTER — Encounter (HOSPITAL_COMMUNITY): Payer: Self-pay | Admitting: Emergency Medicine

## 2016-12-29 DIAGNOSIS — D259 Leiomyoma of uterus, unspecified: Secondary | ICD-10-CM | POA: Insufficient documentation

## 2016-12-29 NOTE — ED Triage Notes (Signed)
Patient with left flank pain, some nausea with the pain.  No vomiting.  Patient states that it has been coming and going all weekend, constantly today.  Denies any urinary symptoms at this time.

## 2016-12-30 ENCOUNTER — Emergency Department (HOSPITAL_COMMUNITY)
Admission: EM | Admit: 2016-12-30 | Discharge: 2016-12-30 | Disposition: A | Discharge status: Home / Self Care | Payer: Self-pay | Attending: Emergency Medicine | Admitting: Emergency Medicine

## 2016-12-30 ENCOUNTER — Emergency Department (HOSPITAL_COMMUNITY): Payer: Self-pay

## 2016-12-30 DIAGNOSIS — D259 Leiomyoma of uterus, unspecified: Secondary | ICD-10-CM

## 2016-12-30 DIAGNOSIS — R1032 Left lower quadrant pain: Secondary | ICD-10-CM

## 2016-12-30 DIAGNOSIS — R1030 Lower abdominal pain, unspecified: Secondary | ICD-10-CM

## 2016-12-30 LAB — COMPREHENSIVE METABOLIC PANEL
ALK PHOS: 47 U/L (ref 38–126)
ALT: 23 U/L (ref 14–54)
ANION GAP: 9 (ref 5–15)
AST: 30 U/L (ref 15–41)
Albumin: 4 g/dL (ref 3.5–5.0)
BUN: 10 mg/dL (ref 6–20)
CALCIUM: 9.1 mg/dL (ref 8.9–10.3)
CHLORIDE: 106 mmol/L (ref 101–111)
CO2: 21 mmol/L — ABNORMAL LOW (ref 22–32)
CREATININE: 0.97 mg/dL (ref 0.44–1.00)
Glucose, Bld: 85 mg/dL (ref 65–99)
Potassium: 4.1 mmol/L (ref 3.5–5.1)
SODIUM: 136 mmol/L (ref 135–145)
Total Bilirubin: 0.6 mg/dL (ref 0.3–1.2)
Total Protein: 7.1 g/dL (ref 6.5–8.1)

## 2016-12-30 LAB — WET PREP, GENITAL
CLUE CELLS WET PREP: NONE SEEN
Sperm: NONE SEEN
TRICH WET PREP: NONE SEEN
WBC, Wet Prep HPF POC: NONE SEEN
YEAST WET PREP: NONE SEEN

## 2016-12-30 LAB — URINALYSIS, ROUTINE W REFLEX MICROSCOPIC
BILIRUBIN URINE: NEGATIVE
Glucose, UA: NEGATIVE mg/dL
Hgb urine dipstick: NEGATIVE
Ketones, ur: NEGATIVE mg/dL
Leukocytes, UA: NEGATIVE
NITRITE: NEGATIVE
PROTEIN: NEGATIVE mg/dL
Specific Gravity, Urine: 1.024 (ref 1.005–1.030)
pH: 5 (ref 5.0–8.0)

## 2016-12-30 LAB — CBC
HCT: 39.3 % (ref 36.0–46.0)
Hemoglobin: 14.1 g/dL (ref 12.0–15.0)
MCH: 28.1 pg (ref 26.0–34.0)
MCHC: 35.9 g/dL (ref 30.0–36.0)
MCV: 78.4 fL (ref 78.0–100.0)
PLATELETS: 264 10*3/uL (ref 150–400)
RBC: 5.01 MIL/uL (ref 3.87–5.11)
RDW: 14 % (ref 11.5–15.5)
WBC: 7 10*3/uL (ref 4.0–10.5)

## 2016-12-30 LAB — POC URINE PREG, ED: PREG TEST UR: NEGATIVE

## 2016-12-30 MED ORDER — MORPHINE SULFATE (PF) 4 MG/ML IV SOLN
4.0000 mg | Freq: Once | INTRAVENOUS | Status: AC
  Administered 2016-12-30: 4 mg via INTRAMUSCULAR
  Filled 2016-12-30: qty 1

## 2016-12-30 MED ORDER — KETOROLAC TROMETHAMINE 15 MG/ML IJ SOLN
30.0000 mg | Freq: Once | INTRAMUSCULAR | Status: AC
  Administered 2016-12-30: 30 mg via INTRAMUSCULAR
  Filled 2016-12-30: qty 2

## 2016-12-30 MED ORDER — NAPROXEN 500 MG PO TABS: 500.0000 mg | ORAL_TABLET | Freq: Two times a day (BID) | ORAL | 0 refills | Status: DC

## 2016-12-30 MED ORDER — OXYCODONE-ACETAMINOPHEN 5-325 MG PO TABS: 1.0000 | ORAL_TABLET | Freq: Four times a day (QID) | ORAL | 0 refills | Status: DC | PRN

## 2016-12-30 NOTE — ED Provider Notes (Signed)
[facility] DEPT Provider Note   CSN: [id number] Arrival date & time: 12/29/16  2245  By signing my name below, I, Judithe Modest, attest that this documentation has been prepared under the direction and in the presence of Thayer Jew, MD. Electronically Signed: Judithe Modest, ER Scribe. 07/13/2016. 2:22 AM.   History   Chief Complaint Chief Complaint  Patient presents with  . Flank Pain   The history is provided by the patient. No language interpreter was used.   HPI Comments: Nicole Whitehead is a 36 y.o. female who presents to the Emergency Department complaining of two weeks of intermittent worsening left sided flank pain with associated nausea. She denies increased pain with movement. She has not taken any medications for her pain. She denies hematuria, urinary frequency, urinary urgency,  fever, chills, vomiting, diarrhea. She is allergic to ceptra and sulfa drugs. She has no PMHx of kidney stones. Denies any vaginal bleeding or vaginal discharge.   Past Medical History:  Diagnosis Date  . ALLERGIC RHINITIS   . 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 Eustachian tube dysfunction   . Labial lesion 2000  . 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 PAP SMEAR, ABNORMAL 2008  . SCOLIOSIS, MILD     Patient Active Problem List   Diagnosis Date Noted  . Non-compliant behavior 03/09/2015  . Routine general medical examination at a health care facility 10/24/2014  . CELLULITIS, THIGH, RIGHT 02/11/2011  . SCOLIOSIS, MILD 12/22/2009  . DENTAL CARIES 07/29/2007  . ALLERGIC RHINITIS 07/28/2007  . PAP SMEAR, ABNORMAL 07/17/2007    Past Surgical History:  Procedure Laterality Date  . COLPOSCOPY  03/10/06  . FOOT SURGERY    . KNEE SURGERY    . NO PAST SURGERIES      OB History    No data available       Home Medications    Prior to Admission medications   Medication Sig Start Date End Date Taking? Authorizing Provider    Multiple Vitamins-Minerals (HAIR VITAMINS) TABS Take 1 tablet by mouth daily.    Yes Historical Provider, MD  naproxen (NAPROSYN) 500 MG tablet Take 1 tablet (500 mg total) by mouth 2 (two) times daily. 12/30/16   Merryl Hacker, MD  oxyCODONE-acetaminophen (PERCOCET/ROXICET) 5-325 MG tablet Take 1-2 tablets by mouth every 6 (six) hours as needed for severe pain. 12/30/16   Merryl Hacker, MD    Family History Family History  Problem Relation Age of Onset  . Breast cancer Paternal Aunt 62    Social History Social History  Substance Use Topics  . Smoking status: Never Smoker  . Smokeless tobacco: Never Used     Comment: Single, lives alone with her dog. worked at [employer] [place] until 12/2012  . Alcohol use No     Allergies   Hydrocodone-acetaminophen; Oxycodone-acetaminophen; Sulfamethoxazole-trimethoprim; and Sulfonamide derivatives   Review of Systems Review of Systems  Constitutional: Negative for chills and fever.  Gastrointestinal: Positive for nausea. Negative for abdominal pain, constipation, diarrhea and vomiting.  Genitourinary: Positive for flank pain. Negative for decreased urine volume, dysuria, frequency, urgency, vaginal bleeding and vaginal discharge.  All other systems reviewed and are negative.    Physical Exam Updated Vital Signs BP 159/94 (BP Location: Left Arm)   Pulse 72   Temp 98.2 F (36.8 C) (Oral)   Resp 15   LMP 12/16/2016 (Exact Date)   SpO2 98%   Physical  Exam  Constitutional: She is oriented to person, place, and time. She appears well-developed and well-nourished.  Overweight  HENT:  Head: Normocephalic and atraumatic.  Cardiovascular: Normal rate, regular rhythm and normal heart sounds.   Pulmonary/Chest: Effort normal and breath sounds normal. No respiratory distress. She has no wheezes.  Abdominal: Soft. Bowel sounds are normal. There is no tenderness. There is no guarding.  Genitourinary:  Genitourinary Comments: No cervical  motion tenderness, no significant vaginal discharge, she does have tenderness to palpation of the left adnexa and uterine fundus, with enlargement of uterine fundus  Neurological: She is alert and oriented to person, place, and time.  Skin: Skin is warm and dry.  Psychiatric: She has a normal mood and affect.  Nursing note and vitals reviewed.    ED Treatments / Results  DIAGNOSTIC STUDIES: Oxygen Saturation is 98% on RA, normal by my interpretation.    COORDINATION OF CARE: 2:22 AM Discussed treatment plan with pt at bedside and pt agreed to plan.  Labs (all labs ordered are listed, but only abnormal results are displayed) Labs Reviewed  COMPREHENSIVE METABOLIC PANEL - Abnormal; Notable for the following:       Result Value   CO2 21 (*)    All other components within normal limits  URINALYSIS, ROUTINE W REFLEX MICROSCOPIC - Abnormal; Notable for the following:    APPearance CLOUDY (*)    All other components within normal limits  WET PREP, GENITAL  CBC  POC URINE PREG, ED  GC/CHLAMYDIA PROBE AMP ([place]) NOT AT [facility]    EKG  EKG Interpretation None       Radiology US Transvaginal Non-ob  Result Date: 12/30/2016 CLINICAL DATA:  Left flank and left lower quadrant pain. Pelvic mass on CT, fibroid versus ovarian. EXAM: TRANSABDOMINAL AND TRANSVAGINAL ULTRASOUND OF PELVIS DOPPLER ULTRASOUND OF OVARIES TECHNIQUE: Both transabdominal and transvaginal ultrasound examinations of the pelvis were performed. Transabdominal technique was performed for global imaging of the pelvis including uterus, ovaries, adnexal regions, and pelvic cul-de-sac. It was necessary to proceed with endovaginal exam following the transabdominal exam to visualize the right and left ovary. Color and duplex Doppler ultrasound was utilized to evaluate blood flow to the ovaries. COMPARISON:  Noncontrast CT abdomen/ pelvis earlier this day. FINDINGS: Uterus Measurements: 8.6 x 3.6 x 4.3 cm. Posterior uterine  fibroid measuring 6.9 x 4.4 x 6.0 cm, partially obscuring evaluation of the remainder of the uterine parenchyma. Endometrium Thickness: 8 mm.  No focal abnormality visualized. Right ovary Measurements: 2.5 x 2.0 x 3.0 cm. Normal appearance. Follicular cyst measures 1.8 cm. No adnexal mass. Normal blood flow. Left ovary Measurements: 2.2 x 1.4 x 1.6 cm. Normal appearance. Normal blood flow. No adnexal mass. Pulsed Doppler evaluation of both ovaries demonstrates normal low-resistance arterial and venous waveforms. Other findings No abnormal free fluid. IMPRESSION: 1. Uterine fibroid accounting for the midline pelvic mass seen on CT. 2. No ovarian torsion. Blood flow to both ovaries. Physiologic cyst in the right ovary, no dedicated imaging follow-up is needed. Electronically Signed   By: Jeb Levering M.D.   On: 12/30/2016 04:36   US Pelvis Complete  Result Date: 12/30/2016 CLINICAL DATA:  Left flank and left lower quadrant pain. Pelvic mass on CT, fibroid versus ovarian. EXAM: TRANSABDOMINAL AND TRANSVAGINAL ULTRASOUND OF PELVIS DOPPLER ULTRASOUND OF OVARIES TECHNIQUE: Both transabdominal and transvaginal ultrasound examinations of the pelvis were performed. Transabdominal technique was performed for global imaging of the pelvis including uterus, ovaries, adnexal regions, and pelvic cul-de-sac.  It was necessary to proceed with endovaginal exam following the transabdominal exam to visualize the right and left ovary. Color and duplex Doppler ultrasound was utilized to evaluate blood flow to the ovaries. COMPARISON:  Noncontrast CT abdomen/ pelvis earlier this day. FINDINGS: Uterus Measurements: 8.6 x 3.6 x 4.3 cm. Posterior uterine fibroid measuring 6.9 x 4.4 x 6.0 cm, partially obscuring evaluation of the remainder of the uterine parenchyma. Endometrium Thickness: 8 mm.  No focal abnormality visualized. Right ovary Measurements: 2.5 x 2.0 x 3.0 cm. Normal appearance. Follicular cyst measures 1.8 cm. No adnexal  mass. Normal blood flow. Left ovary Measurements: 2.2 x 1.4 x 1.6 cm. Normal appearance. Normal blood flow. No adnexal mass. Pulsed Doppler evaluation of both ovaries demonstrates normal low-resistance arterial and venous waveforms. Other findings No abnormal free fluid. IMPRESSION: 1. Uterine fibroid accounting for the midline pelvic mass seen on CT. 2. No ovarian torsion. Blood flow to both ovaries. Physiologic cyst in the right ovary, no dedicated imaging follow-up is needed. Electronically Signed   By: Jeb Levering M.D.   On: 12/30/2016 04:36   [place] Art/ven Flow Abd Pelv Doppler  Result Date: 12/30/2016 CLINICAL DATA:  Left flank and left lower quadrant pain. Pelvic mass on CT, fibroid versus ovarian. EXAM: TRANSABDOMINAL AND TRANSVAGINAL ULTRASOUND OF PELVIS DOPPLER ULTRASOUND OF OVARIES TECHNIQUE: Both transabdominal and transvaginal ultrasound examinations of the pelvis were performed. Transabdominal technique was performed for global imaging of the pelvis including uterus, ovaries, adnexal regions, and pelvic cul-de-sac. It was necessary to proceed with endovaginal exam following the transabdominal exam to visualize the right and left ovary. Color and duplex Doppler ultrasound was utilized to evaluate blood flow to the ovaries. COMPARISON:  Noncontrast CT abdomen/ pelvis earlier this day. FINDINGS: Uterus Measurements: 8.6 x 3.6 x 4.3 cm. Posterior uterine fibroid measuring 6.9 x 4.4 x 6.0 cm, partially obscuring evaluation of the remainder of the uterine parenchyma. Endometrium Thickness: 8 mm.  No focal abnormality visualized. Right ovary Measurements: 2.5 x 2.0 x 3.0 cm. Normal appearance. Follicular cyst measures 1.8 cm. No adnexal mass. Normal blood flow. Left ovary Measurements: 2.2 x 1.4 x 1.6 cm. Normal appearance. Normal blood flow. No adnexal mass. Pulsed Doppler evaluation of both ovaries demonstrates normal low-resistance arterial and venous waveforms. Other findings No abnormal free fluid.  IMPRESSION: 1. Uterine fibroid accounting for the midline pelvic mass seen on CT. 2. No ovarian torsion. Blood flow to both ovaries. Physiologic cyst in the right ovary, no dedicated imaging follow-up is needed. Electronically Signed   By: Jeb Levering M.D.   On: 12/30/2016 04:36   Ct Renal Stone Study  Result Date: 12/30/2016 CLINICAL DATA:  Left flank pain. EXAM: CT ABDOMEN AND PELVIS WITHOUT CONTRAST TECHNIQUE: Multidetector CT imaging of the abdomen and pelvis was performed following the standard protocol without IV contrast. COMPARISON:  None. FINDINGS: Lower chest: The lung bases are clear. Hepatobiliary: No focal liver abnormality is seen. No gallstones, gallbladder wall thickening, or biliary dilatation. Pancreas: No ductal dilatation or inflammation. Spleen: Normal in size without focal abnormality. Adrenals/Urinary Tract: No adrenal nodule. No renal stones or hydronephrosis. No perinephric edema. Urinary bladder is physiologically distended. No stones or wall thickening. Stomach/Bowel: Stomach is decompressed. No bowel dilatation or inflammation. Portions of the appendix tentatively identified and are normal. No pericecal or right lower quadrant inflammation. Vascular/Lymphatic: No significant vascular findings are present. No enlarged abdominal or pelvic lymph nodes. Reproductive: There is a rounded 7.2 x 7.2 cm structure in the posterior midline,  abutting the posterior uterus, unclear whether this represents a uterine fibroid or left adnexal lesion medially deviated, left ovary is not seen. Right ovary tentatively identified. Small amount of free fluid in the pelvis. Other: No upper abdominal ascites.  No free air. Musculoskeletal: There are no acute or suspicious osseous abnormalities. IMPRESSION: 1. No renal stones or obstructive uropathy. 2. Posterior midline pelvic mass, may be a uterine fibroid or left adnexal lesion medially deviated. Recommend pelvic ultrasound with Doppler. These results  were called by telephone at the time of interpretation on 12/30/2016 at 3:27 am to Dr. Thayer Jew , who verbally acknowledged these results. Electronically Signed   By: Jeb Levering M.D.   On: 12/30/2016 03:27    Procedures Procedures (including critical care time)  Medications Ordered in ED Medications  ketorolac (TORADOL) 15 MG/ML injection 30 mg (30 mg Intramuscular Given 12/30/16 0311)  morphine 4 MG/ML injection 4 mg (4 mg Intramuscular Given 12/30/16 0544)     Initial Impression / Assessment and Plan / ED Course  I have reviewed the triage vital signs and the nursing notes.  Pertinent labs & imaging results that were available during my care of the patient were reviewed by me and considered in my medical decision making (see chart for details).     Patient presents with acute on chronic left flank pain and lower abdominal pain. Nontoxic on exam. Overweight. No reproducible flank or CVA tenderness. She does have tenderness with palpation of the uterine fundus and left adnexa. Initial evaluation with CT stone study negative for stone but concerning for adnexal mass. Follow-up ultrasound shows no torsion. She does have a uterine fibroid. Patient given pain and nausea medication. Will discharge home with naproxen and a short course of Norco. [facility] if symptoms worsen or persist.  Final Clinical Impressions(s) / ED Diagnoses   Final diagnoses:  Left lower quadrant pain  Lower abdominal pain  Uterine leiomyoma, unspecified location    New Prescriptions New Prescriptions   NAPROXEN (NAPROSYN) 500 MG TABLET    Take 1 tablet (500 mg total) by mouth 2 (two) times daily.   OXYCODONE-ACETAMINOPHEN (PERCOCET/ROXICET) 5-325 MG TABLET    Take 1-2 tablets by mouth every 6 (six) hours as needed for severe pain.     I personally performed the services described in this documentation, which was scribed in my presence. The recorded information has been reviewed  and is accurate.        Merryl Hacker, MD 12/30/16 [phone]

## 2016-12-30 NOTE — Discharge Instructions (Signed)
You were seen today for abdominal flank pain. Your workup is only notable for uterine fibroids. You will be given a short course of pain medication. Follow-up with [facility] outpatient clinic if symptoms continue or worsen.

## 2016-12-30 NOTE — ED Notes (Signed)
Patient transported to Ultrasound 

## 2016-12-31 LAB — GC/CHLAMYDIA PROBE AMP (~~LOC~~) NOT AT ARMC
CHLAMYDIA, DNA PROBE: NEGATIVE
NEISSERIA GONORRHEA: NEGATIVE

## 2017-01-29 ENCOUNTER — Ambulatory Visit (INDEPENDENT_AMBULATORY_CARE_PROVIDER_SITE_OTHER): Payer: Self-pay | Admitting: Obstetrics & Gynecology

## 2017-01-29 ENCOUNTER — Encounter: Payer: Self-pay | Admitting: Obstetrics & Gynecology

## 2017-01-29 VITALS — BP 132/82 | HR 68 | Wt 202.3 lb

## 2017-01-29 DIAGNOSIS — Z0000 Encounter for general adult medical examination without abnormal findings: Secondary | ICD-10-CM

## 2017-01-29 DIAGNOSIS — D251 Intramural leiomyoma of uterus: Secondary | ICD-10-CM

## 2017-01-29 LAB — POCT URINALYSIS DIP (DEVICE)
Bilirubin Urine: NEGATIVE
GLUCOSE, UA: NEGATIVE mg/dL
Hgb urine dipstick: NEGATIVE
Ketones, ur: NEGATIVE mg/dL
Leukocytes, UA: NEGATIVE
Nitrite: NEGATIVE
PH: 7.5 (ref 5.0–8.0)
PROTEIN: NEGATIVE mg/dL
SPECIFIC GRAVITY, URINE: 1.02 (ref 1.005–1.030)
UROBILINOGEN UA: 0.2 mg/dL (ref 0.0–1.0)

## 2017-01-29 NOTE — Progress Notes (Signed)
   Subjective:    Patient ID: Nicole Whitehead, female    DOB: 12/29/1980, 36 y.o.   MRN: [id number]  HPI  36 yo S AA P0 here for follow up after a ER visit for Left flank pain. During the work up a 6.9 cm posterior wall fibroid was noted. She mentions increased frequency of urination.  Review of Systems Periods are monthy for 3 days each month. Uses condoms Abstinent for a year. Works at [employer] She would like a kid, buts wants to find the right guy.    Objective:   Physical Exam [id number] Breathing, conversing, and ambulating normally Abd- benign       Assessment & Plan:  Preventative care- info on free pap clinic given, declines flu vaccine Fibroid- relatively asymptomatic, reassurance given

## 2017-01-29 NOTE — Progress Notes (Signed)
Pt given contact info for Free Pap Screening

## 2017-03-10 ENCOUNTER — Other Ambulatory Visit: Payer: Self-pay

## 2017-03-19 LAB — CYTOLOGY - PAP
Adequacy: ABSENT
Diagnosis: UNDETERMINED
HPV: NOT DETECTED

## 2017-06-19 IMAGING — US US TRANSVAGINAL NON-OB
1 series · 13 of 25 positions shown · non-contrast
Comparison: Noncontrast CT abdomen/ pelvis earlier this day.

CLINICAL DATA: Left flank and left lower quadrant pain. Pelvic mass
on CT, fibroid versus ovarian.

EXAM:
TRANSABDOMINAL AND TRANSVAGINAL ULTRASOUND OF PELVIS
DOPPLER ULTRASOUND OF OVARIES
TECHNIQUE: Both transabdominal and transvaginal ultrasound examinations of the
pelvis were performed. Transabdominal technique was performed for
global imaging of the pelvis including uterus, ovaries, adnexal
regions, and pelvic cul-de-sac.
It was necessary to proceed with endovaginal exam following the
transabdominal exam to visualize the right and left ovary. Color and
duplex Doppler ultrasound was utilized to evaluate blood flow to the
ovaries.

[Series 1: us transvaginal non-ob · 0.24mm/px · 13 of 107 slices shown]
[im 1/107]
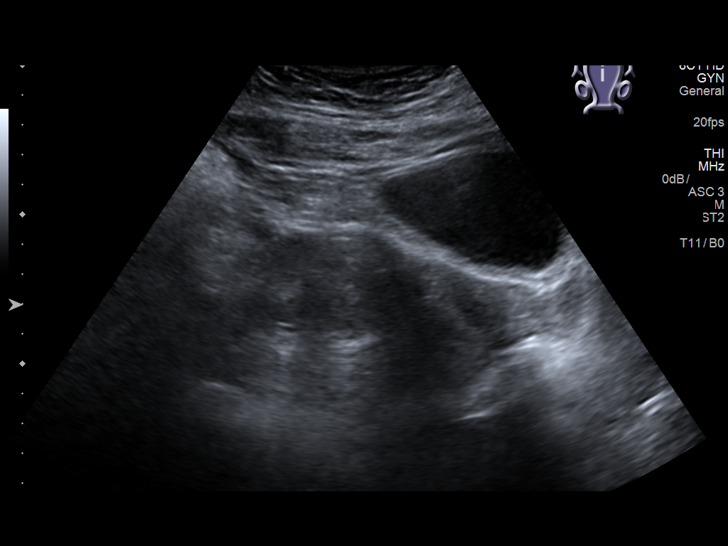
[im 9/107]
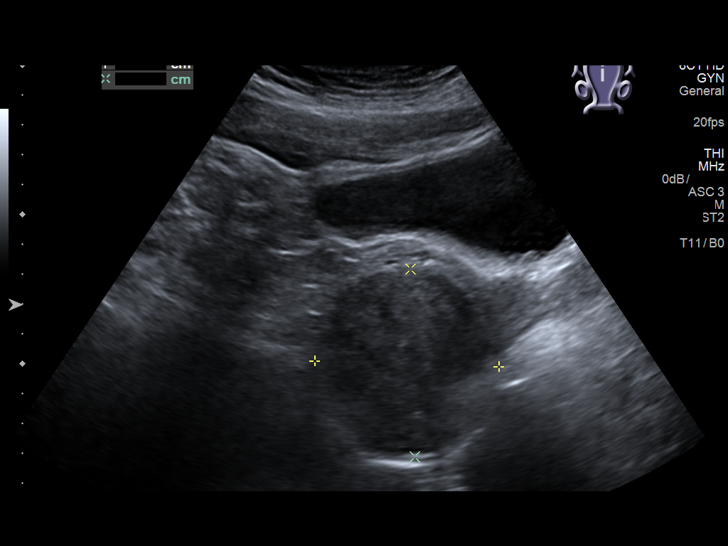
[im 18/107]
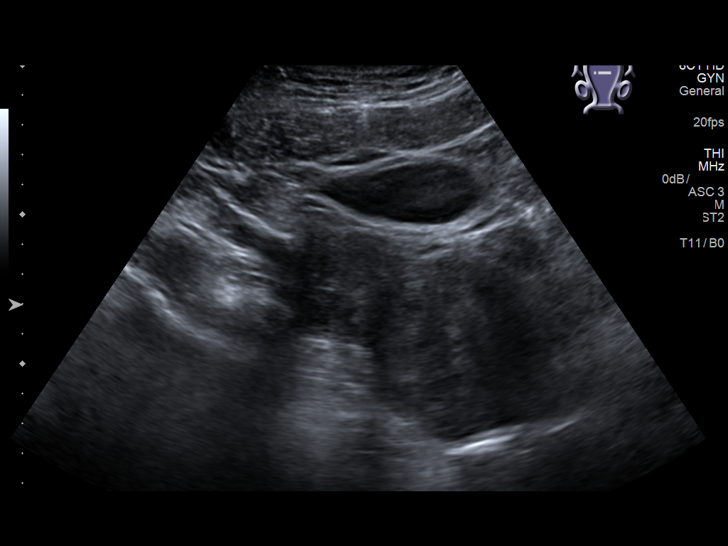
[im 27/107]
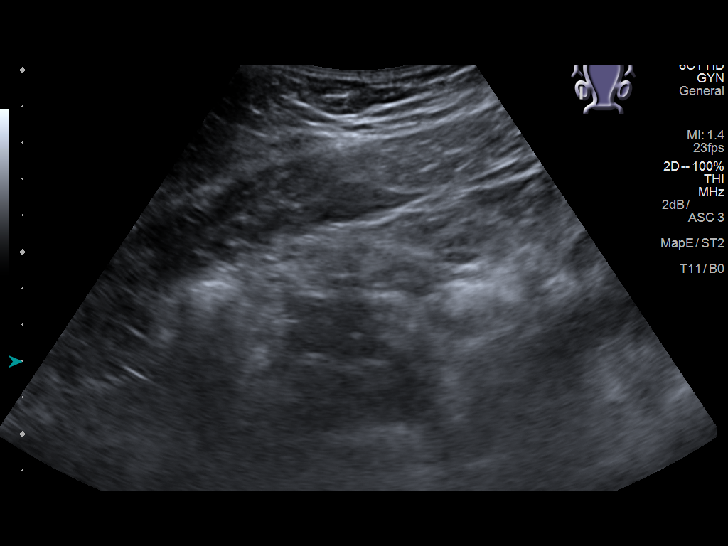
[im 36/107]
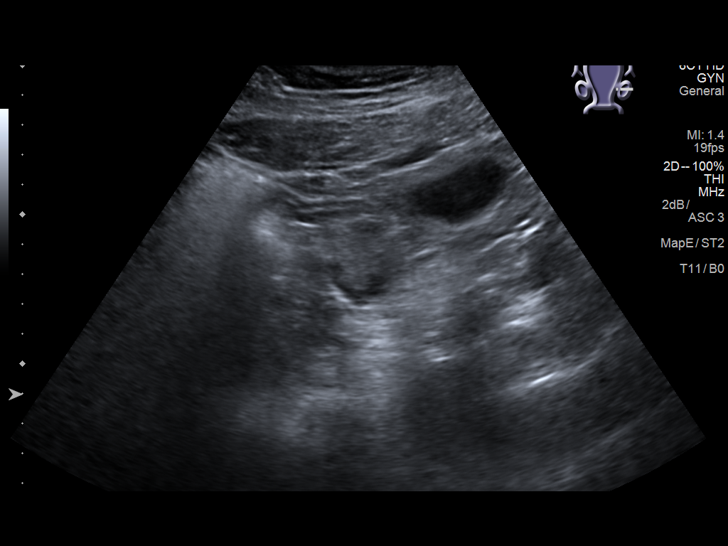
[im 45/107]
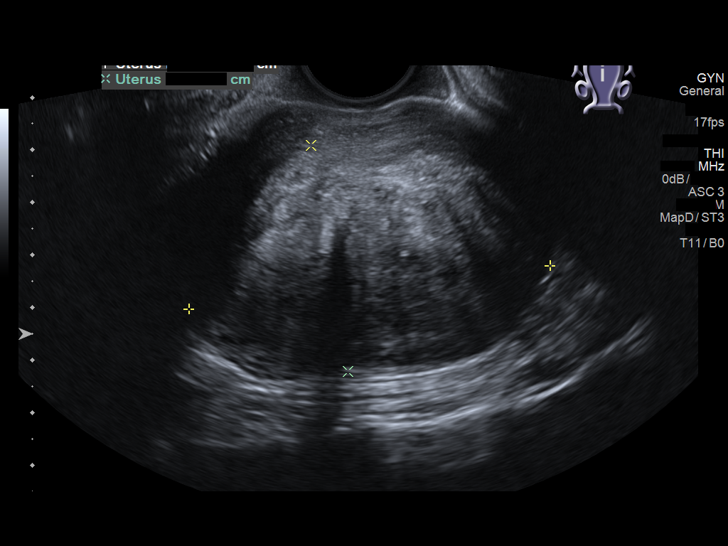
[im 54/107]
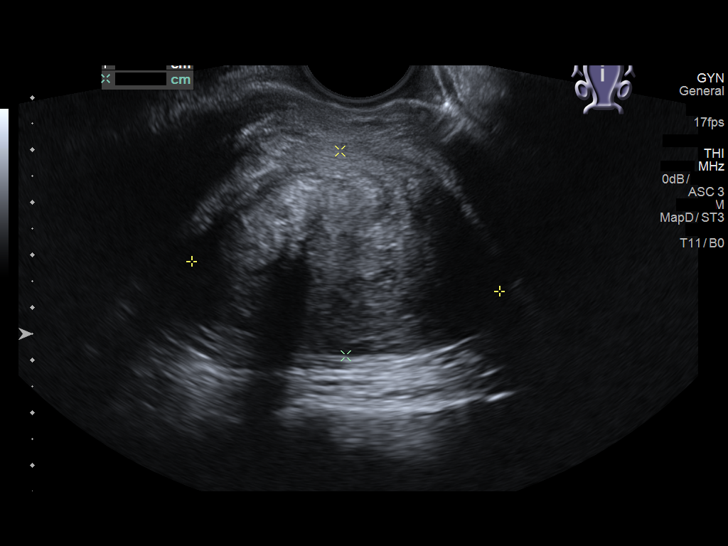
[im 62/107]
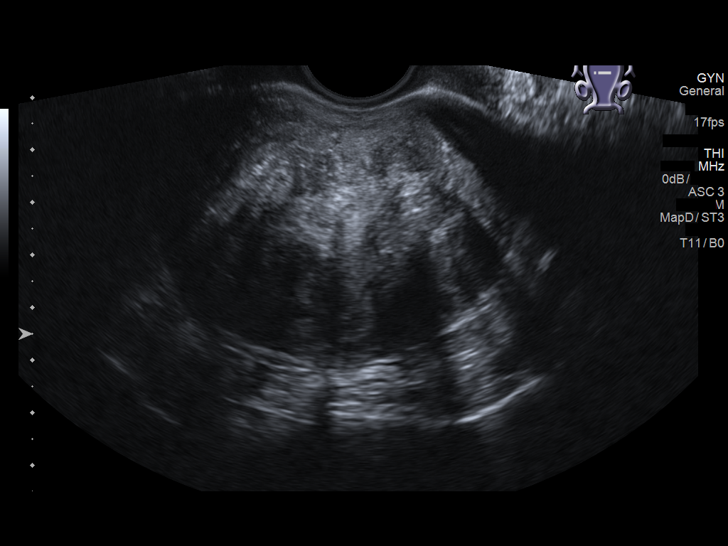
[im 71/107]
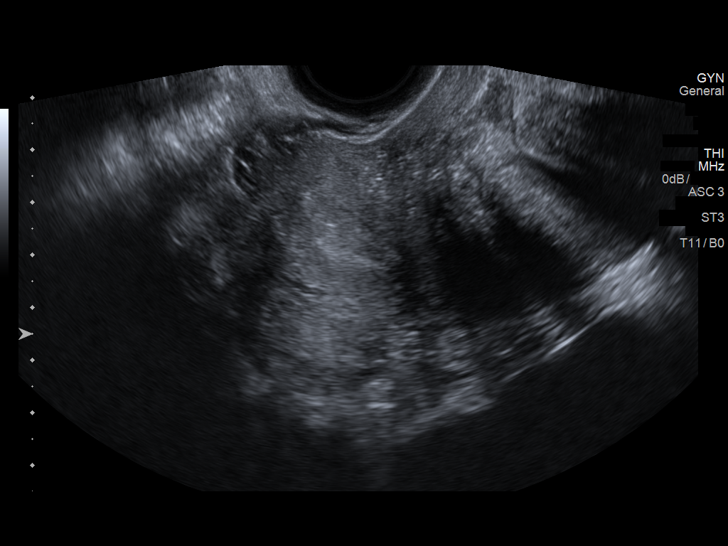
[im 80/107]
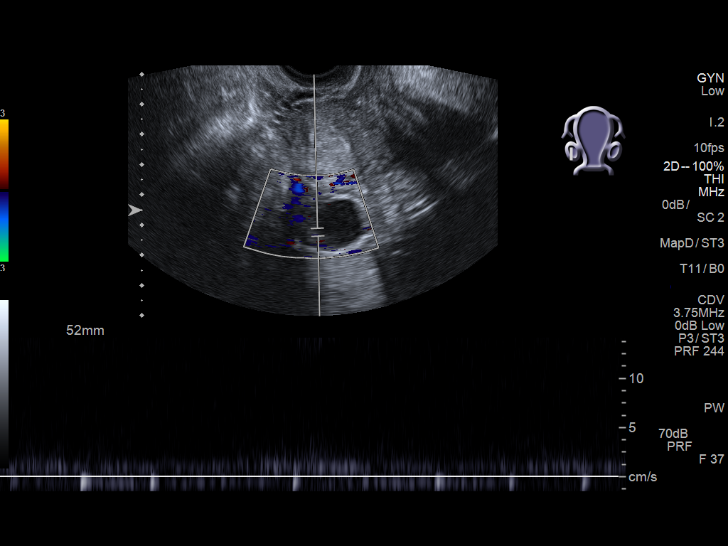
[im 89/107]
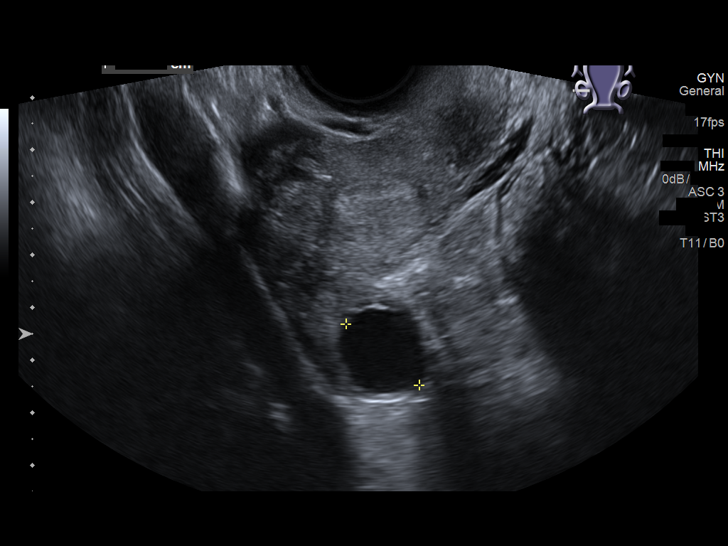
[im 98/107]
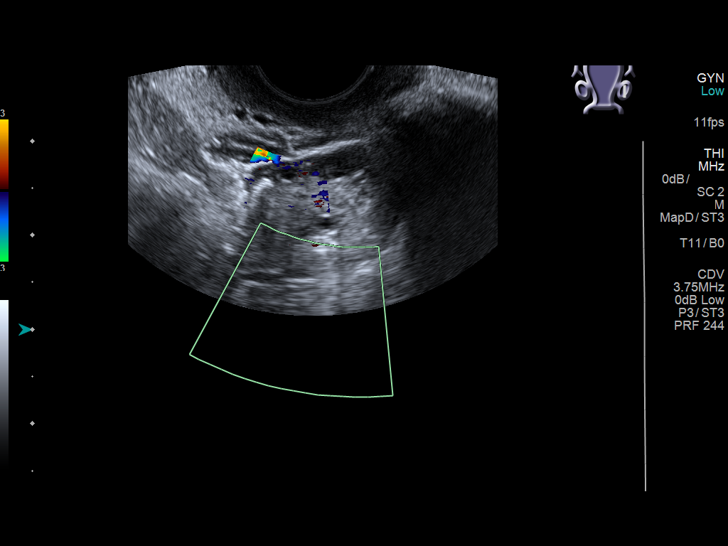
[im 107/107]
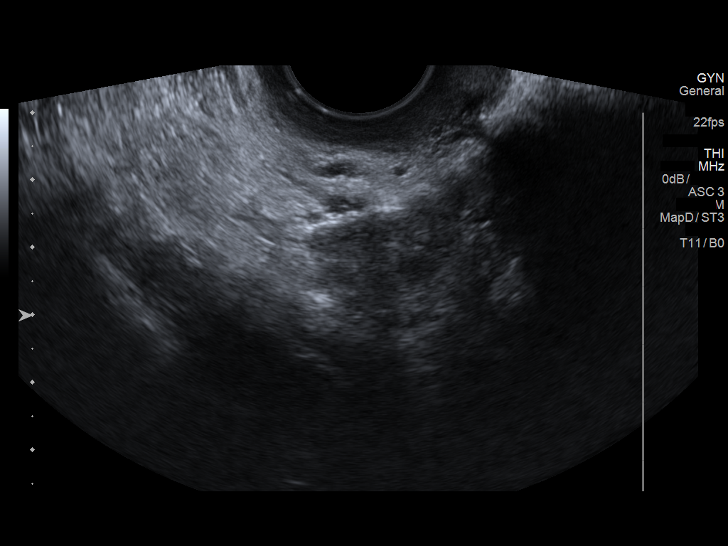

[13 of 25 positions shown; findings below may reference images not displayed]

FINDINGS: Uterus

Measurements: 8.6 x 3.6 x 4.3 cm. Posterior uterine fibroid
measuring 6.9 x 4.4 x 6.0 cm, partially obscuring evaluation of the
remainder of the uterine parenchyma.

Endometrium

Thickness: 8 mm.  No focal abnormality visualized.

Right ovary

Measurements: 2.5 x 2.0 x 3.0 cm. Normal appearance. Follicular cyst
measures 1.8 cm. No adnexal mass. Normal blood flow.

Left ovary

Measurements: 2.2 x 1.4 x 1.6 cm. Normal appearance. Normal blood
flow. No adnexal mass.

Pulsed Doppler evaluation of both ovaries demonstrates normal
low-resistance arterial and venous waveforms.

Other findings

No abnormal free fluid.
IMPRESSION: 1. Uterine fibroid accounting for the midline pelvic mass seen on
CT.
2. No ovarian torsion. Blood flow to both ovaries. Physiologic cyst
in the right ovary, no dedicated imaging follow-up is needed.

## 2018-02-23 ENCOUNTER — Other Ambulatory Visit: Payer: Self-pay

## 2018-02-25 LAB — CYTOLOGY - PAP: Diagnosis: NEGATIVE

## 2018-09-14 ENCOUNTER — Other Ambulatory Visit: Payer: Self-pay

## 2018-09-14 ENCOUNTER — Ambulatory Visit: Payer: Self-pay | Admitting: Family

## 2018-09-18 ENCOUNTER — Other Ambulatory Visit (HOSPITAL_COMMUNITY): Payer: Self-pay | Admitting: *Deleted

## 2018-09-18 DIAGNOSIS — Z1231 Encounter for screening mammogram for malignant neoplasm of breast: Secondary | ICD-10-CM

## 2018-09-18 LAB — CYTOLOGY - PAP
Adequacy: ABSENT
DIAGNOSIS: NEGATIVE

## 2018-12-10 ENCOUNTER — Ambulatory Visit (HOSPITAL_COMMUNITY): Payer: Self-pay

## 2020-02-23 ENCOUNTER — Other Ambulatory Visit: Payer: Self-pay

## 2020-02-23 ENCOUNTER — Emergency Department (HOSPITAL_COMMUNITY): Payer: 59

## 2020-02-23 ENCOUNTER — Encounter (HOSPITAL_COMMUNITY): Payer: Self-pay | Admitting: Emergency Medicine

## 2020-02-23 ENCOUNTER — Emergency Department (HOSPITAL_COMMUNITY)
Admission: EM | Admit: 2020-02-23 | Discharge: 2020-02-24 | Disposition: A | Discharge status: Home / Self Care | Payer: 59 | Attending: Emergency Medicine | Admitting: Emergency Medicine

## 2020-02-23 DIAGNOSIS — K5900 Constipation, unspecified: Secondary | ICD-10-CM | POA: Insufficient documentation

## 2020-02-23 DIAGNOSIS — Z79899 Other long term (current) drug therapy: Secondary | ICD-10-CM | POA: Insufficient documentation

## 2020-02-23 DIAGNOSIS — M5412 Radiculopathy, cervical region: Secondary | ICD-10-CM | POA: Diagnosis not present

## 2020-02-23 DIAGNOSIS — R109 Unspecified abdominal pain: Secondary | ICD-10-CM | POA: Insufficient documentation

## 2020-02-23 DIAGNOSIS — R0789 Other chest pain: Secondary | ICD-10-CM | POA: Diagnosis present

## 2020-02-23 DIAGNOSIS — R079 Chest pain, unspecified: Secondary | ICD-10-CM

## 2020-02-23 LAB — CBC
HCT: 41 % (ref 36.0–46.0)
Hemoglobin: 14.6 g/dL (ref 12.0–15.0)
MCH: 28.1 pg (ref 26.0–34.0)
MCHC: 35.6 g/dL (ref 30.0–36.0)
MCV: 78.8 fL — ABNORMAL LOW (ref 80.0–100.0)
Platelets: 304 10*3/uL (ref 150–400)
RBC: 5.2 MIL/uL — ABNORMAL HIGH (ref 3.87–5.11)
RDW: 13.7 % (ref 11.5–15.5)
WBC: 8.3 10*3/uL (ref 4.0–10.5)
nRBC: 0 % (ref 0.0–0.2)

## 2020-02-23 LAB — I-STAT BETA HCG BLOOD, ED (MC, WL, AP ONLY): I-stat hCG, quantitative: <5 m[IU]/mL (ref <5)

## 2020-02-23 MED ORDER — SODIUM CHLORIDE 0.9% FLUSH: 3.0000 mL | Freq: Once | INTRAVENOUS | Status: DC

## 2020-02-24 ENCOUNTER — Emergency Department (HOSPITAL_COMMUNITY): Payer: 59

## 2020-02-24 LAB — TROPONIN I (HIGH SENSITIVITY)
Troponin I (High Sensitivity): 3 ng/L (ref <18)
Troponin I (High Sensitivity): 4 ng/L (ref <18)

## 2020-02-24 LAB — BASIC METABOLIC PANEL
Anion gap: 9 (ref 5–15)
BUN: 9 mg/dL (ref 6–20)
CO2: 25 mmol/L (ref 22–32)
Calcium: 9.3 mg/dL (ref 8.9–10.3)
Chloride: 104 mmol/L (ref 98–111)
Creatinine, Ser: 0.93 mg/dL (ref 0.44–1.00)
GFR calc Af Amer: >60 mL/min (ref >60)
GFR calc non Af Amer: >60 mL/min (ref >60)
Glucose, Bld: 117 mg/dL — ABNORMAL HIGH (ref 70–99)
Potassium: 3.5 mmol/L (ref 3.5–5.1)
Sodium: 138 mmol/L (ref 135–145)

## 2020-02-24 LAB — D-DIMER, QUANTITATIVE: D-Dimer, Quant: <0.27 ug/mL-FEU (ref 0.00–0.50)

## 2020-02-24 MED ORDER — CYCLOBENZAPRINE HCL 10 MG PO TABS
5.0000 mg | ORAL_TABLET | Freq: Once | ORAL | Status: AC
  Administered 2020-02-24: 10:00:00 5 mg via ORAL
  Filled 2020-02-24: qty 1

## 2020-02-24 MED ORDER — PREDNISONE 10 MG PO TABS: 20.0000 mg | ORAL_TABLET | Freq: Every day | ORAL | 0 refills | Status: DC

## 2020-02-24 MED ORDER — CYCLOBENZAPRINE HCL 5 MG PO TABS: 5.0000 mg | ORAL_TABLET | Freq: Three times a day (TID) | ORAL | 0 refills | Status: DC | PRN

## 2020-02-24 MED ORDER — PREDNISONE 20 MG PO TABS
60.0000 mg | ORAL_TABLET | Freq: Once | ORAL | Status: AC
  Administered 2020-02-24: 60 mg via ORAL
  Filled 2020-02-24: qty 3

## 2020-02-24 NOTE — Discharge Instructions (Signed)
Take prednisone as prescribed for possible pinched nerve in your neck   Take flexeril for muscle spasms   Take tylenol, motrin for pain   You are constipation. Take miralax daily.   [facility]   See neurosurgery for right arm numbness   Return to ER if you have worse numbness, weakness, worse chest pain, trouble breathing

## 2020-02-24 NOTE — ED Provider Notes (Signed)
[facility] EMERGENCY DEPARTMENT Provider Note   CSN: [id number] Arrival date & time: 02/23/20  2251     History Chief Complaint  Patient presents with  . Chest Pain    Nicole Whitehead is a 39 y.o. female otherwise healthy here presenting with chest pain.  Patient states that she has been having intermittent chest pain for the last several weeks.  States that last night while at work, her chest pain got worse.  It is substernal.  Associated with some palpitations.  Patient denies any radiation to the pain.  Denies any recent travel or history of blood clots.  She also complains of some right arm numbness and tingling for several weeks.  Denies any heavy lifting or neck injury.  She denies any weakness to the right arm.  Denies any Covid exposure.  Finally, patient also has been constipated for the last several days.  She also experienced some abdominal distention but no vomiting or fevers.  The history is provided by the patient.       Past Medical History:  Diagnosis Date  . ALLERGIC RHINITIS   . 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 Eustachian tube dysfunction   . Labial lesion 2000  . 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 PAP SMEAR, ABNORMAL 2008  . SCOLIOSIS, MILD     Patient Active Problem List   Diagnosis Date Noted  . Non-compliant behavior 03/09/2015  . Routine general medical examination at a health care facility 10/24/2014  . CELLULITIS, THIGH, RIGHT 02/11/2011  . SCOLIOSIS, MILD 12/22/2009  . DENTAL CARIES 07/29/2007  . ALLERGIC RHINITIS 07/28/2007  . PAP SMEAR, ABNORMAL 07/17/2007    Past Surgical History:  Procedure Laterality Date  . COLPOSCOPY  03/10/06  . FOOT SURGERY    . KNEE SURGERY    . NO PAST SURGERIES       OB History   No obstetric history on file.     Family History  Problem Relation Age of Onset  . Breast cancer Paternal Aunt 77    Social History   Tobacco Use  .  Smoking status: Never Smoker  . Smokeless tobacco: Never Used  . Tobacco comment: Single, lives alone with her dog. worked at [employer] until 12/2012  Substance Use Topics  . Alcohol use: No  . Drug use: No    Home Medications Prior to Admission medications   Medication Sig Start Date End Date Taking? Authorizing Provider  Multiple Vitamins-Minerals (HAIR VITAMINS) TABS Take 1 tablet by mouth daily.     [provider]  naproxen (NAPROSYN) 500 MG tablet Take 1 tablet (500 mg total) by mouth 2 (two) times daily. 12/30/16   Horton, Barbette Hair, MD    Allergies    Hydrocodone-acetaminophen, Oxycodone-acetaminophen, Sulfamethoxazole-trimethoprim, and Sulfonamide derivatives  Review of Systems   Review of Systems  Cardiovascular: Positive for chest pain.  All other systems reviewed and are negative.   Physical Exam Updated Vital Signs BP (!) 143/80 (BP Location: Left Arm)   Pulse 65   Temp 98.8 F (37.1 C) (Oral)   Resp 14   Ht 5\' 8"  (1.727 m)   Wt 97.5 kg   SpO2 100%   BMI 32.69 kg/m   Physical Exam Vitals and nursing note reviewed.  Constitutional:      Appearance: She is well-developed.     Comments: Anxious   HENT:     Head: Normocephalic.  Eyes:     Extraocular Movements: Extraocular movements intact.     Pupils: Pupils are equal, round, and reactive to light.  Cardiovascular:     Rate and Rhythm: Normal rate and regular rhythm.     Heart sounds: Normal heart sounds.  Pulmonary:     Effort: Pulmonary effort is normal.     Breath sounds: Normal breath sounds.  Abdominal:     General: Bowel sounds are normal.     Palpations: Abdomen is soft.  Musculoskeletal:        General: Normal range of motion.     Cervical back: Normal range of motion and neck supple.  Skin:    General: Skin is warm.     Capillary Refill: Capillary refill takes less than 2 seconds.  Neurological:     General: No focal deficit present.     Mental Status: She is alert and  oriented to person, place, and time.     Comments: Slightly dec sensation R thumb and index finger, nl hand grasp, nl wrist flexion and extension, nl elbow flexion and extension. Nl finger to nose bilaterally   Psychiatric:        Mood and Affect: Mood normal.        Behavior: Behavior normal.     ED Results / Procedures / Treatments   Labs (all labs ordered are listed, but only abnormal results are displayed) Labs Reviewed  BASIC METABOLIC PANEL - Abnormal; Notable for the following components:      Result Value   Glucose, Bld 117 (*)    All other components within normal limits  CBC - Abnormal; Notable for the following components:   RBC 5.20 (*)    MCV 78.8 (*)    All other components within normal limits  D-DIMER, QUANTITATIVE (NOT AT [facility])  I-STAT BETA HCG BLOOD, ED ([facility], [facility], [facility] ONLY)  TROPONIN I (HIGH SENSITIVITY)  TROPONIN I (HIGH SENSITIVITY)    EKG EKG Interpretation  Date/Time:  Thursday February 24 2020 08:41:38 EDT Ventricular Rate:  70 PR Interval:  130 QRS Duration: 73 QT Interval:  391 QTC Calculation: 422 R Axis:   44 Text Interpretation: Sinus rhythm rate slower since earlier in the day Confirmed by Wandra Arthurs [phone]) on 02/24/2020 9:15:11 AM   Radiology DG Chest 2 View  Result Date: 02/23/2020 CLINICAL DATA:  Chest pain EXAM: CHEST - 2 VIEW COMPARISON:  None. FINDINGS: Heart and mediastinal contours are within normal limits. No focal opacities or effusions. No acute bony abnormality. IMPRESSION: No active cardiopulmonary disease. Electronically Signed   By: Rolm Baptise M.D.   On: 02/23/2020 23:20   DG Abd 2 Views  Result Date: 02/24/2020 CLINICAL DATA:  Abdominal distention. Lower abdominal pain. Constipation. EXAM: ABDOMEN - 2 VIEW COMPARISON:  CT 12/30/2016. FINDINGS: Soft tissue structures are unremarkable. Stool noted throughout the colon. No bowel distention or free air. Pelvic calcifications consistent with phleboliths. Degenerative changes and  scoliosis lumbar spine. IMPRESSION: Stool noted throughout the colon. No bowel distention or free air. No acute abnormality identified. Electronically Signed   By: Marcello Moores  Register   On: 02/24/2020 09:14    Procedures Procedures (including critical care time)  Medications Ordered in ED Medications  sodium chloride flush (NS) 0.9 % injection 3 mL (3 mLs Intravenous Not Given 02/24/20 0908)  predniSONE (DELTASONE) tablet 60 mg (60 mg Oral Given 02/24/20 1017)  cyclobenzaprine (FLEXERIL) tablet 5 mg (5 mg Oral Given 02/24/20 1018)    ED Course  I have reviewed the triage vital signs and the nursing notes.  Pertinent labs & imaging results that were available during my care of the patient were reviewed by me and considered in my medical decision making (see chart for details).    MDM Rules/Calculators/A&P                      NATIA HUYCK is a 39 y.o. female here presenting with chest pain, right arm and numbness and constipation.  Chest pain is intermittently for the last several weeks that got worse yesterday.  On arrival she was tachycardic. She is low risk for PE so we will get a D-dimer.  Also low suspicion for ACS so will get 2 sets of troponins.  Her right arm numbness and tingling is likely radiculopathy.  She has normal motor strength bilaterally.  She has no neck pain or injury.  Will hold off on neck MRI and will treat symptomatically with prednisone and Flexeril.  Will get x-ray for constipation.  12:07 PM Trop neg x 2. D-dimer negative.  X-ray showed constipation as well. Will discharge home with prednisone and Flexeril for right arm radiculopathy.  Stable for discharge.  Final Clinical Impression(s) / ED Diagnoses Final diagnoses:  Abdominal pain    Rx / DC Orders ED Discharge Orders    None       Drenda Freeze, MD 02/24/20 1207

## 2020-02-24 NOTE — ED Notes (Signed)
Pt reports CP onset earlier this evening while at work. Pt highly anxious in triage, tearful.

## 2020-11-18 ENCOUNTER — Encounter (HOSPITAL_COMMUNITY): Payer: Self-pay

## 2020-11-18 ENCOUNTER — Ambulatory Visit (HOSPITAL_COMMUNITY)
Admission: EM | Admit: 2020-11-18 | Discharge: 2020-11-18 | Disposition: A | Discharge status: Home / Self Care | Payer: 59 | Attending: Family Medicine | Admitting: Family Medicine

## 2020-11-18 ENCOUNTER — Other Ambulatory Visit: Payer: Self-pay

## 2020-11-18 DIAGNOSIS — S01511A Laceration without foreign body of lip, initial encounter: Secondary | ICD-10-CM

## 2020-11-18 MED ORDER — IBUPROFEN 800 MG PO TABS: 800.0000 mg | ORAL_TABLET | Freq: Three times a day (TID) | ORAL | 0 refills | Status: DC

## 2020-11-18 NOTE — ED Triage Notes (Signed)
Pt reports having a gash inside lower lip. States she was working out and a ball hit her face 1 hr ago approx. Pt is concern for her teeth as she have invisaling.  Denies headache, blurry vision, dizziness.

## 2020-11-18 NOTE — ED Provider Notes (Signed)
°  [facility]   [id number] 11/18/20 Arrival Time: 0347  ASSESSMENT & PLAN:  1. Lip laceration, initial encounter    No repair needed. Should heal well. Discussed lip wound healing and what to expect. Soft foods. Gentle teeth brushing. Teeth are intact.  Meds ordered this encounter  Medications   ibuprofen (ADVIL) 800 MG tablet    Sig: Take 1 tablet (800 mg total) by mouth 3 (three) times daily with meals.    Dispense:  21 tablet    Refill:  0   May f/u here as needed. Reviewed expectations re: course of current medical issues. Questions answered. Outlined signs and symptoms indicating need for more acute intervention. Patient verbalized understanding. After Visit Summary given.   SUBJECTIVE:  Nicole Whitehead is a 39 y.o. female who presents with a laceration of her mid inner lower lip. Hit by medicine ball at gym. Moderate bleeding that has stopped. Is painful. Feels her teeth are intact. Wearing invisiline now and at time of injury.   Health Maintenance Due  Topic Date Due   Hepatitis C Screening  Never done   COVID-19 Vaccine (1) Never done   TETANUS/TDAP  12/23/2019   INFLUENZA VACCINE  07/02/2020    OBJECTIVE:  Vitals:   11/18/20 1048  BP: (!) 147/80  Pulse: 75  Resp: 18  Temp: 98.4 F (36.9 C)  TempSrc: Temporal  SpO2: 98%     General appearance: alert; no distress Oropharynx: mid lower inner lip with puncture/laceration; at rest edges approximate well; does not go through her lip; teeth are intact; normal tongue; no active bleeding Psychological: alert and cooperative; normal mood and affect   Allergies  Allergen Reactions   Hydrocodone-Acetaminophen    Oxycodone-Acetaminophen    Sulfa Antibiotics     Other reaction(s): Unknown   Sulfamethoxazole-Trimethoprim Other (See Comments)    REACTION: Not Listed   Sulfonamide Derivatives     REACTION: Not Listed    Past Medical History:  Diagnosis Date   ALLERGIC RHINITIS     CELLULITIS, THIGH, RIGHT 02/11/2011   MRSA   Eustachian tube dysfunction    Labial lesion 2000   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 worked at [employer] until 12/2012  Substance and Sexual Activity   Alcohol use: No   Drug use: No   Sexual activity: Not Currently    Birth control/protection: Condom  Other Topics Concern   Not on file  Social History Narrative   Not on file   Social Determinants of Health   Financial Resource Strain: Not on file  Food Insecurity: Not on file  Transportation Needs: Not on file  Physical Activity: Not on file  Stress: Not on file  Social Connections: Not on file         [place], MD 11/18/20 1129

## 2020-12-06 ENCOUNTER — Other Ambulatory Visit: Payer: Self-pay

## 2021-03-07 DIAGNOSIS — Z1231 Encounter for screening mammogram for malignant neoplasm of breast: Secondary | ICD-10-CM | POA: Diagnosis not present

## 2021-04-11 IMAGING — DX DG CHEST 2V
1 series · 1 of 1 positions shown · non-contrast
Comparison: None.

CLINICAL DATA: Chest pain

EXAM:
CHEST - 2 VIEW

[chest lat]
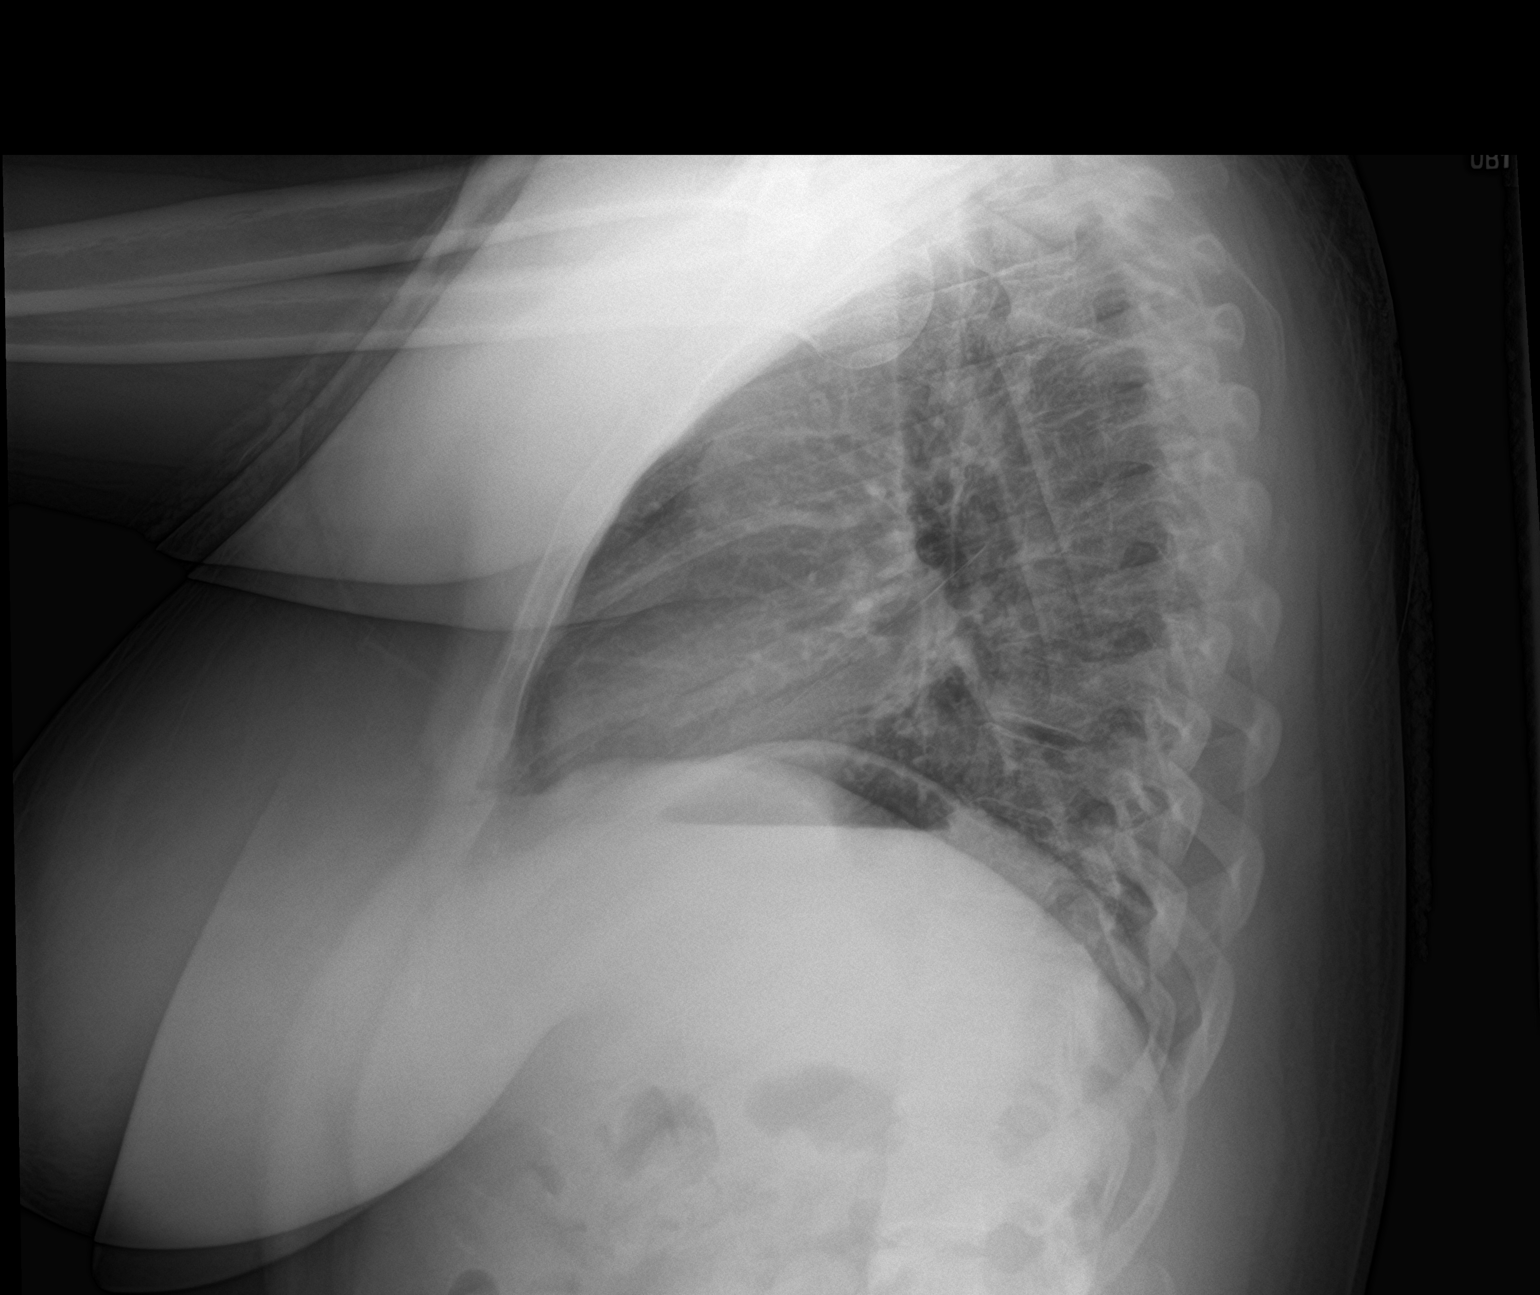

[1 of 1 positions shown; findings below may reference images not displayed]

FINDINGS: Heart and mediastinal contours are within normal limits. No focal
opacities or effusions. No acute bony abnormality.
IMPRESSION: No active cardiopulmonary disease.

## 2021-04-12 IMAGING — DX DG ABDOMEN 2V
2 series · 2 of 2 positions shown · non-contrast
Comparison: CT 12/30/2016.

CLINICAL DATA: Abdominal distention. Lower abdominal pain.
Constipation.

EXAM:
ABDOMEN - 2 VIEW

[abdomen erect]
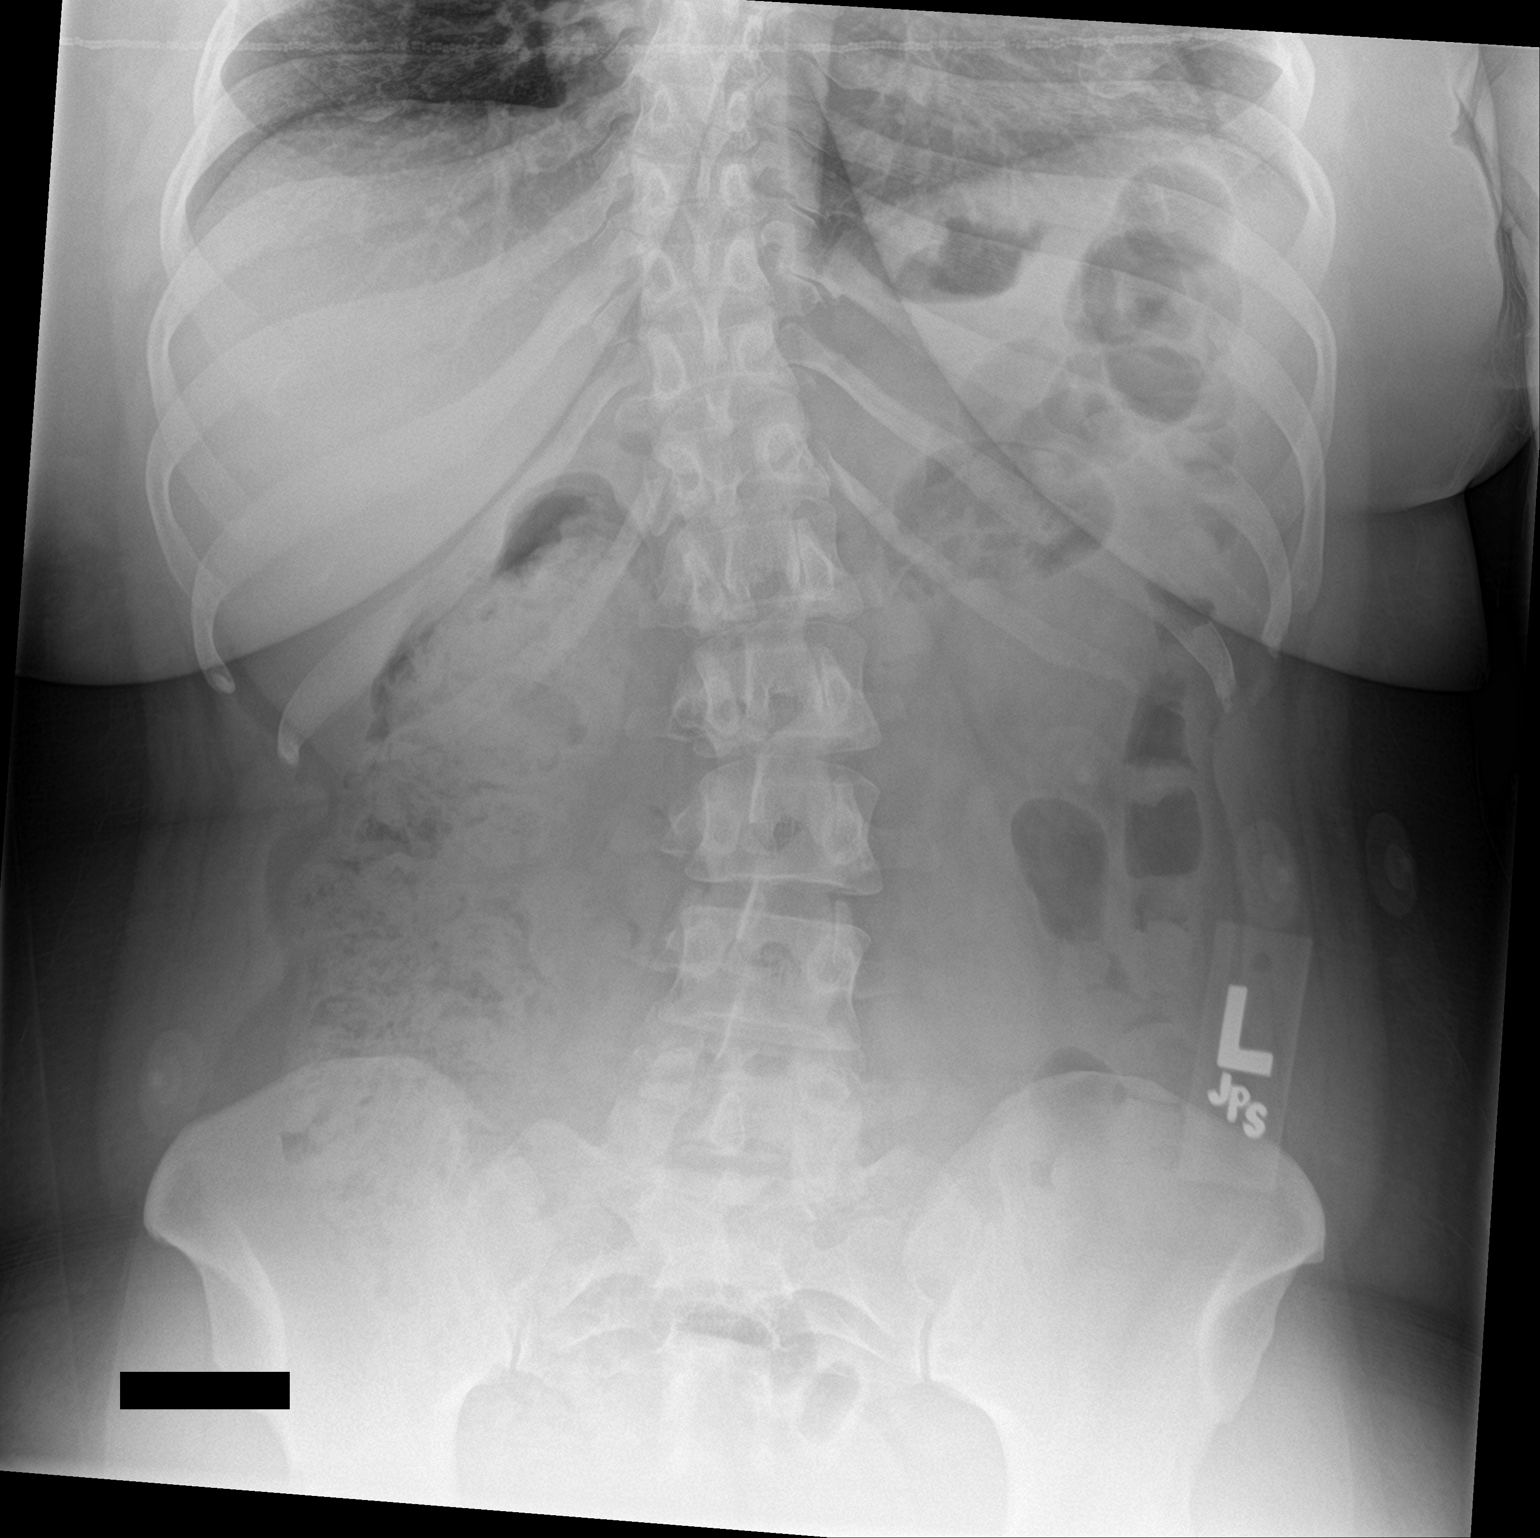

[abdomen supine]
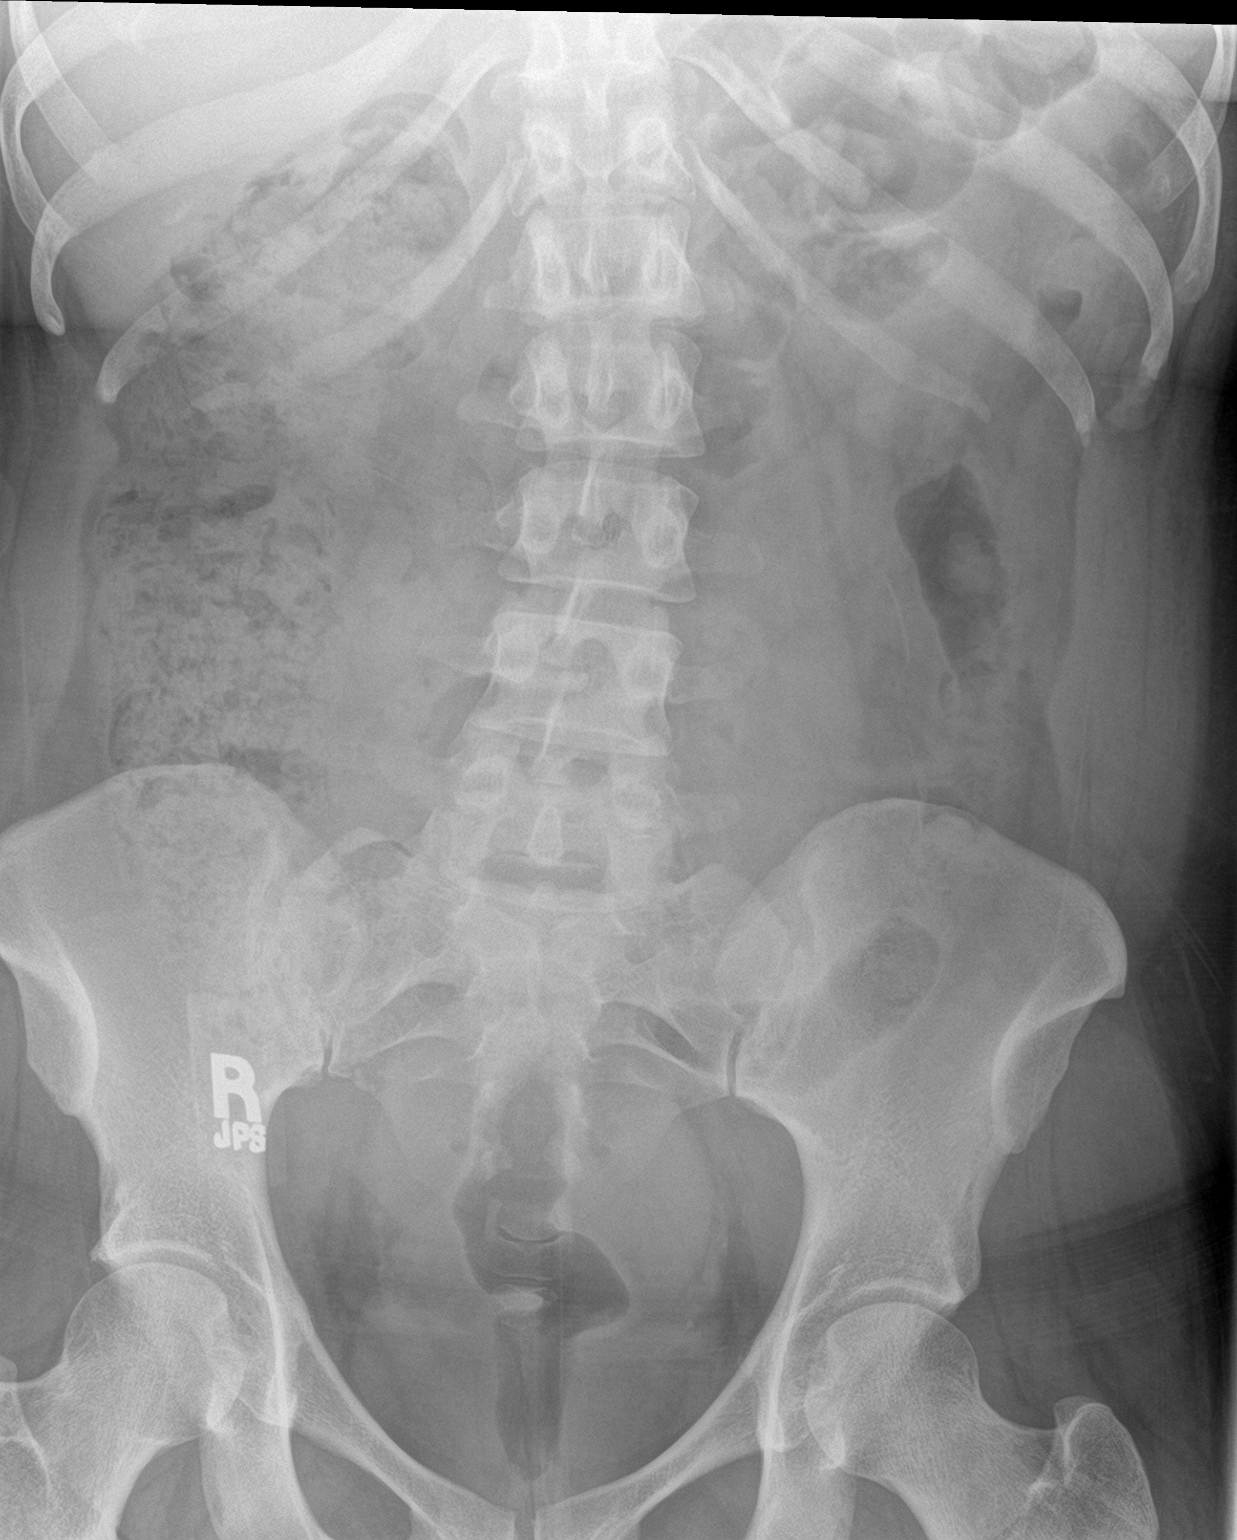

[2 of 2 positions shown; findings below may reference images not displayed]

FINDINGS: Soft tissue structures are unremarkable. Stool noted throughout the
colon. No bowel distention or free air. Pelvic calcifications
consistent with phleboliths. Degenerative changes and scoliosis
lumbar spine.
IMPRESSION: Stool noted throughout the colon. No bowel distention or free air.
No acute abnormality identified.

## 2021-05-04 DIAGNOSIS — J029 Acute pharyngitis, unspecified: Secondary | ICD-10-CM | POA: Diagnosis not present

## 2021-05-04 DIAGNOSIS — Z20822 Contact with and (suspected) exposure to covid-19: Secondary | ICD-10-CM | POA: Diagnosis not present

## 2021-10-08 DIAGNOSIS — M545 Low back pain, unspecified: Secondary | ICD-10-CM | POA: Diagnosis not present

## 2021-10-08 DIAGNOSIS — M9901 Segmental and somatic dysfunction of cervical region: Secondary | ICD-10-CM | POA: Diagnosis not present

## 2021-10-08 DIAGNOSIS — M9903 Segmental and somatic dysfunction of lumbar region: Secondary | ICD-10-CM | POA: Diagnosis not present

## 2021-10-08 DIAGNOSIS — M62830 Muscle spasm of back: Secondary | ICD-10-CM | POA: Diagnosis not present

## 2021-11-15 ENCOUNTER — Ambulatory Visit (INDEPENDENT_AMBULATORY_CARE_PROVIDER_SITE_OTHER): Payer: BC Managed Care – PPO

## 2021-11-15 ENCOUNTER — Encounter (HOSPITAL_COMMUNITY): Payer: Self-pay

## 2021-11-15 ENCOUNTER — Ambulatory Visit (HOSPITAL_COMMUNITY)
Admission: EM | Admit: 2021-11-15 | Discharge: 2021-11-15 | Disposition: A | Discharge status: Home / Self Care | Payer: BC Managed Care – PPO | Attending: Family Medicine | Admitting: Family Medicine

## 2021-11-15 ENCOUNTER — Other Ambulatory Visit: Payer: Self-pay

## 2021-11-15 DIAGNOSIS — M25561 Pain in right knee: Secondary | ICD-10-CM | POA: Diagnosis not present

## 2021-11-15 MED ORDER — NAPROXEN 500 MG PO TABS: 500.0000 mg | ORAL_TABLET | Freq: Two times a day (BID) | ORAL | 0 refills | Status: DC | PRN

## 2021-11-15 NOTE — ED Provider Notes (Addendum)
[facility]    CSN: [id number] Arrival date & time: 11/15/21  0940      History   Chief Complaint Chief Complaint  Patient presents with   Knee Pain    HPI Nicole Whitehead is a 40 y.o. female.    Knee Pain Here for right knee pain since 12/12. She has been working out for a long time, usually weights and treadmill. She did do weights/strength training about 12/10. On 12/12 when she ran on the treadmill, her knee began bothering her. It worsened over the next few days, until this AM the pain was more intense and she is having trouble going up and down stairs, and sitting. No popping/locking, but feels like it might need to pop. No swelling. No f/c/rash. She does feel like something is moving around in there. Has not gotten to try anything for pain.  No noted fall/injury to the knee.  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      Home Medications    Prior to Admission medications   Medication Sig Start Date End Date Taking? Authorizing Provider  naproxen (NAPROSYN) 500 MG tablet Take 1 tablet (500 mg total) by mouth 2 (two) times daily as needed. 11/15/21  Yes Barrett Henle, MD  Multiple  Vitamins-Minerals (HAIR VITAMINS) TABS Take 1 tablet by mouth daily.     [provider]    Family History Family History  Problem Relation Age of Onset   Breast cancer Paternal Aunt 64    Social History Social History   Tobacco Use   Smoking status: Never   Smokeless tobacco: Never   Tobacco comments:    Single, lives alone with her dog. worked at [employer] until 12/2012  Substance Use Topics   Alcohol use: No   Drug use: No     Allergies   Hydrocodone-acetaminophen, Oxycodone-acetaminophen, Sulfa antibiotics, Sulfamethoxazole-trimethoprim, and Sulfonamide derivatives   Review of Systems Review of Systems   Physical Exam Triage Vital Signs ED Triage Vitals [11/15/21 1116]  Enc Vitals Group     BP 137/86     Pulse Rate 71     Resp 18     Temp 98.5 F (36.9 C)     Temp Source Oral     SpO2 97 %     Weight      Height      Head Circumference      Peak Flow      Pain Score 6     Pain Loc      Pain Edu?  Excl. in [place]?    No data found.  Updated Vital Signs BP 137/86 (BP Location: Left Arm)    Pulse 71    Temp 98.5 F (36.9 C) (Oral)    Resp 18    LMP 11/06/2021    SpO2 97%   Visual Acuity Right Eye Distance:   Left Eye Distance:   Bilateral Distance:    Right Eye Near:   Left Eye Near:    Bilateral Near:     Physical Exam Vitals reviewed.  Constitutional:      General: She is not in acute distress.    Appearance: She is not toxic-appearing.  Musculoskeletal:        General: Tenderness (medial joint line and inferior pole of right knee. No effusion discerned) present. No deformity.     Right lower leg: No edema.     Left lower leg: No edema.  Neurological:     Mental Status: She is alert and oriented to person, place, and time.  Psychiatric:        Behavior: Behavior normal.     UC Treatments / Results  Labs (all labs ordered are listed, but only abnormal results are displayed) Labs Reviewed - No data to  display  EKG   Radiology DG Knee Complete 4 Views Right  Result Date: 11/15/2021 CLINICAL DATA:  Right knee pain. EXAM: RIGHT KNEE - COMPLETE 4+ VIEW COMPARISON:  None. FINDINGS: No fracture. No subluxation or dislocation. Trace spurring noted patellofemoral and medial compartments. No worrisome lytic or sclerotic osseous abnormality. IMPRESSION: Negative. Electronically Signed   By: Misty Stanley M.D.   On: 11/15/2021 11:56    Procedures Procedures (including critical care time)  Medications Ordered in UC Medications - No data to display  Initial Impression / Assessment and Plan / UC Course  I have reviewed the triage vital signs and the nursing notes.  Pertinent labs & imaging results that were available during my care of the patient were reviewed by me and considered in my medical decision making (see chart for details).     will check xray since she has a FB/loose body sensation. Consider meniscus tear, or other soft tissue injury such as a ligamentous sprain  Xray is negative for any bony abnormality.  Naproxen 500 mg twice daily as needed for pain. Call ortho if not improving.   Final Clinical Impressions(s) / UC Diagnoses   Final diagnoses:  Acute pain of right knee     Discharge Instructions      Take naproxen 500 mg twice daily as needed for the pain.  You can try icing the knee. Also a knee sleeve brace to add support.     ED Prescriptions     Medication Sig Dispense Auth. Provider   naproxen (NAPROSYN) 500 MG tablet Take 1 tablet (500 mg total) by mouth 2 (two) times daily as needed. 60 tablet Lam Bjorklund, Gwenlyn Perking, MD      PDMP not reviewed this encounter.   Barrett Henle, MD 11/15/21 1207    Barrett Henle, MD 11/15/21 [phone]

## 2021-11-15 NOTE — ED Triage Notes (Signed)
Pt c/o rt knee pain since Monday. States pain worse last night radiating up rt leg. Denies injury. States has been running on the treadmill with no problems prior to this. States needs a work note.

## 2021-11-15 NOTE — Discharge Instructions (Addendum)
Take naproxen 500 mg twice daily as needed for the pain.  You can try icing the knee. Also a knee sleeve brace to add support.

## 2022-02-06 ENCOUNTER — Other Ambulatory Visit: Payer: Self-pay

## 2022-02-06 ENCOUNTER — Other Ambulatory Visit (HOSPITAL_COMMUNITY)
Admission: RE | Admit: 2022-02-06 | Discharge: 2022-02-06 | Disposition: A | Discharge status: Home / Self Care | Payer: BC Managed Care – PPO | Source: Ambulatory Visit | Attending: Internal Medicine | Admitting: Internal Medicine

## 2022-02-06 ENCOUNTER — Encounter: Payer: Self-pay | Admitting: Internal Medicine

## 2022-02-06 ENCOUNTER — Ambulatory Visit: Payer: BC Managed Care – PPO | Admitting: Internal Medicine

## 2022-02-06 VITALS — BP 116/74 | HR 60 | Resp 18 | Ht 68.0 in | Wt 219.6 lb

## 2022-02-06 DIAGNOSIS — Z0000 Encounter for general adult medical examination without abnormal findings: Secondary | ICD-10-CM

## 2022-02-06 DIAGNOSIS — Z23 Encounter for immunization: Secondary | ICD-10-CM

## 2022-02-06 DIAGNOSIS — Z136 Encounter for screening for cardiovascular disorders: Secondary | ICD-10-CM

## 2022-02-06 DIAGNOSIS — R87619 Unspecified abnormal cytological findings in specimens from cervix uteri: Secondary | ICD-10-CM | POA: Diagnosis not present

## 2022-02-06 LAB — COMPREHENSIVE METABOLIC PANEL
ALT: 17 U/L (ref 0–35)
AST: 18 U/L (ref 0–37)
Albumin: 4.2 g/dL (ref 3.5–5.2)
Alkaline Phosphatase: 49 U/L (ref 39–117)
BUN: 10 mg/dL (ref 6–23)
CO2: 27 mEq/L (ref 19–32)
Calcium: 9.2 mg/dL (ref 8.4–10.5)
Chloride: 104 mEq/L (ref 96–112)
Creatinine, Ser: 0.88 mg/dL (ref 0.40–1.20)
GFR: 82.24 mL/min (ref >60.00)
Glucose, Bld: 83 mg/dL (ref 70–99)
Potassium: 4 mEq/L (ref 3.5–5.1)
Sodium: 140 mEq/L (ref 135–145)
Total Bilirubin: 0.4 mg/dL (ref 0.2–1.2)
Total Protein: 7.1 g/dL (ref 6.0–8.3)

## 2022-02-06 LAB — LIPID PANEL
Cholesterol: 192 mg/dL (ref 0–200)
HDL: 69.4 mg/dL (ref >39.00)
LDL Cholesterol: 113 mg/dL — ABNORMAL HIGH (ref 0–99)
NonHDL: 122.25
Total CHOL/HDL Ratio: 3
Triglycerides: 47 mg/dL (ref 0.0–149.0)
VLDL: 9.4 mg/dL (ref 0.0–40.0)

## 2022-02-06 LAB — CBC
HCT: 39.4 % (ref 36.0–46.0)
Hemoglobin: 13.9 g/dL (ref 12.0–15.0)
MCHC: 35.2 g/dL (ref 30.0–36.0)
MCV: 81.5 fl (ref 78.0–100.0)
Platelets: 251 10*3/uL (ref 150.0–400.0)
RBC: 4.83 Mil/uL (ref 3.87–5.11)
RDW: 14.8 % (ref 11.5–15.5)
WBC: 4.9 10*3/uL (ref 4.0–10.5)

## 2022-02-06 LAB — HEMOGLOBIN A1C: Hgb A1c MFr Bld: 5.6 % (ref 4.6–6.5)

## 2022-02-06 NOTE — Progress Notes (Signed)
? ?  Subjective:  ? ?Patient ID: Nicole Whitehead, female    DOB: August 15, 1981, 41 y.o.   MRN: [id number] ? ?HPI ?The patient is a new 41 YO female coming in for physical ? ?PMH, [facility], social history reviewed and updated ? ?Review of Systems  ?Constitutional: Negative.   ?HENT: Negative.    ?Eyes: Negative.   ?Respiratory:  Negative for cough, chest tightness and shortness of breath.   ?Cardiovascular:  Negative for chest pain, palpitations and leg swelling.  ?Gastrointestinal:  Negative for abdominal distention, abdominal pain, constipation, diarrhea, nausea and vomiting.  ?Musculoskeletal: Negative.   ?Skin: Negative.   ?Neurological: Negative.   ?Psychiatric/Behavioral: Negative.    ? ?Objective:  ?Physical Exam ?Constitutional:   ?   Appearance: She is well-developed.  ?HENT:  ?   Head: Normocephalic and atraumatic.  ?Cardiovascular:  ?   Rate and Rhythm: Normal rate and regular rhythm.  ?Pulmonary:  ?   Effort: Pulmonary effort is normal. No respiratory distress.  ?   Breath sounds: Normal breath sounds. No wheezing or rales.  ?Abdominal:  ?   General: Bowel sounds are normal. There is no distension.  ?   Palpations: Abdomen is soft.  ?   Tenderness: There is no abdominal tenderness. There is no rebound.  ?Genitourinary: ?   Comments: Pap smear done with chaperone present ?Musculoskeletal:  ?   Cervical back: Normal range of motion.  ?Skin: ?   General: Skin is warm and dry.  ?Neurological:  ?   Mental Status: She is alert and oriented to person, place, and time.  ?   Coordination: Coordination normal.  ? ? ?Vitals:  ? 02/06/22 0853  ?BP: 116/74  ?Pulse: 60  ?Resp: 18  ?SpO2: 99%  ?Weight: 219 lb 9.6 oz (99.6 kg)  ?Height: '5\' 8"'$  (1.727 m)  ? ? ?This visit occurred during the SARS-CoV-2 public health emergency.  Safety protocols were in place, including screening questions prior to the visit, additional usage of staff PPE, and extensive cleaning of exam room while observing appropriate contact time as indicated  for disinfecting solutions.  ? ?Assessment & Plan:  ?Tdap given at visit ?

## 2022-02-07 ENCOUNTER — Telehealth: Payer: Self-pay | Admitting: Internal Medicine

## 2022-02-07 LAB — CYTOLOGY - PAP
Adequacy: ABSENT
Comment: NEGATIVE
Diagnosis: NEGATIVE
High risk HPV: NEGATIVE

## 2022-02-07 NOTE — Telephone Encounter (Signed)
Pt making provider aware her mammogram is scheduled on 03-13-2022 w/ [facility] ?

## 2022-02-08 NOTE — Assessment & Plan Note (Signed)
Due for repeat will check HPV status regardless of outcome.  ?

## 2022-02-08 NOTE — Assessment & Plan Note (Signed)
Flu shot declines. Covid-19 declines. Tetanus given. Mammogram counseled will get done, pap smear done at visit. Counseled about sun safety and mole surveillance. Counseled about the dangers of distracted driving. Given 10 year screening recommendations.  ? ?

## 2022-03-13 DIAGNOSIS — Z1231 Encounter for screening mammogram for malignant neoplasm of breast: Secondary | ICD-10-CM | POA: Diagnosis not present

## 2022-03-22 ENCOUNTER — Encounter: Payer: Self-pay | Admitting: Internal Medicine

## 2022-03-22 ENCOUNTER — Ambulatory Visit: Payer: BC Managed Care – PPO | Admitting: Internal Medicine

## 2022-03-22 VITALS — BP 124/72 | HR 86 | Resp 18 | Ht 68.0 in | Wt 220.4 lb

## 2022-03-22 DIAGNOSIS — K921 Melena: Secondary | ICD-10-CM | POA: Diagnosis not present

## 2022-03-22 DIAGNOSIS — R194 Change in bowel habit: Secondary | ICD-10-CM

## 2022-03-22 MED ORDER — HYDROCORTISONE (PERIANAL) 2.5 % EX CREA: 1.0000 "application " | TOPICAL_CREAM | Freq: Two times a day (BID) | CUTANEOUS | 0 refills | Status: DC

## 2022-03-22 MED ORDER — LINACLOTIDE 145 MCG PO CAPS: 145.0000 ug | ORAL_CAPSULE | Freq: Every day | ORAL | 3 refills | Status: DC

## 2022-03-22 NOTE — Assessment & Plan Note (Signed)
Some hemorrhoid detected rx anusol. Still needs referral to GI in setting of new bowel changes.  ?

## 2022-03-22 NOTE — Progress Notes (Signed)
? ?  Subjective:  ? ?Patient ID: Nicole Whitehead, female    DOB: [birth year], 41 y.o.   MRN: [id number] ? ?HPI ?The patient is a 41 YO female coming in for new bowel change and blood in stool. She has been regular most of life and in the few months problems being unable to go to bathroom. Has tried fiber and miralax otc without relief. No change to diet or lifestyle in that time. Also intermittent blood in stool or blood with wiping. Now having some itching rectally with BM.  ? ?Review of Systems  ?Constitutional: Negative.   ?HENT: Negative.    ?Eyes: Negative.   ?Respiratory:  Negative for cough, chest tightness and shortness of breath.   ?Cardiovascular:  Negative for chest pain, palpitations and leg swelling.  ?Gastrointestinal:  Positive for abdominal distention, abdominal pain, blood in stool and constipation. Negative for diarrhea, nausea and vomiting.  ?Musculoskeletal: Negative.   ?Skin: Negative.   ?Neurological: Negative.   ?Psychiatric/Behavioral: Negative.    ? ?Objective:  ?Physical Exam ?Constitutional:   ?   Appearance: She is well-developed.  ?HENT:  ?   Head: Normocephalic and atraumatic.  ?Cardiovascular:  ?   Rate and Rhythm: Normal rate and regular rhythm.  ?Pulmonary:  ?   Effort: Pulmonary effort is normal. No respiratory distress.  ?   Breath sounds: Normal breath sounds. No wheezing or rales.  ?Abdominal:  ?   General: Bowel sounds are normal. There is distension.  ?   Palpations: Abdomen is soft.  ?   Tenderness: There is abdominal tenderness. There is no rebound.  ?   Comments: Mild tenderness   ?Genitourinary: ?   Comments: Small external hemorrhoid about 6 o'clock no mass detected in rectal vault ?Musculoskeletal:  ?   Cervical back: Normal range of motion.  ?Skin: ?   General: Skin is warm and dry.  ?Neurological:  ?   Mental Status: She is alert and oriented to person, place, and time.  ?   Coordination: Coordination normal.  ? ? ?Vitals:  ? 03/22/22 0813  ?BP: 124/72  ?Pulse: 86   ?Resp: 18  ?SpO2: 99%  ?Weight: 220 lb 6.4 oz (100 kg)  ?Height: '5\' 8"'$  (1.727 m)  ? ? ?This visit occurred during the SARS-CoV-2 public health emergency.  Safety protocols were in place, including screening questions prior to the visit, additional usage of staff PPE, and extensive cleaning of exam room while observing appropriate contact time as indicated for disinfecting solutions.  ? ?Assessment & Plan:  ? ?

## 2022-03-22 NOTE — Assessment & Plan Note (Addendum)
Needs referral to GI for colonoscopy. No prior colonoscopy. New change in bowel habits as well as intermittent blood in stool. Rx linzess 145 mcg daily to see if this helps in the meantime.  ?

## 2022-03-22 NOTE — Patient Instructions (Addendum)
We have sent in linzess in case you want to try it for the constipation. ? ?We have sent in the cream to use a pea sized amount on the outside twice a day to help the hemorrhoids. ? ?[phone] ?

## 2022-03-25 ENCOUNTER — Encounter: Payer: Self-pay | Admitting: Gastroenterology

## 2022-03-25 ENCOUNTER — Other Ambulatory Visit: Payer: Self-pay

## 2022-03-25 ENCOUNTER — Ambulatory Visit: Payer: BC Managed Care – PPO | Admitting: Gastroenterology

## 2022-03-25 VITALS — BP 165/103 | HR 69 | Temp 98.5°F | Ht 68.0 in | Wt 221.4 lb

## 2022-03-25 DIAGNOSIS — K5909 Other constipation: Secondary | ICD-10-CM | POA: Diagnosis not present

## 2022-03-25 DIAGNOSIS — K625 Hemorrhage of anus and rectum: Secondary | ICD-10-CM

## 2022-03-25 MED ORDER — POLYETHYLENE GLYCOL 3350 17 GM/SCOOP PO POWD: 1.0000 | Freq: Every day | ORAL | 0 refills | Status: DC

## 2022-03-25 MED ORDER — NA SULFATE-K SULFATE-MG SULF 17.5-3.13-1.6 GM/177ML PO SOLN: 354.0000 mL | Freq: Once | ORAL | 0 refills | Status: DC

## 2022-03-25 MED ORDER — NA SULFATE-K SULFATE-MG SULF 17.5-3.13-1.6 GM/177ML PO SOLN: 354.0000 mL | Freq: Once | ORAL | 0 refills | Status: AC

## 2022-03-25 NOTE — Patient Instructions (Signed)
Mix 32 ounces of Gatorade with 238 grams of miralax. Drink 8oz every 20 minutes till solution is gone.  ? ?High-Fiber Eating Plan ?Fiber, also called dietary fiber, is a type of carbohydrate. It is found foods such as fruits, vegetables, whole grains, and beans. A high-fiber diet can have many health benefits. Your health care provider may recommend a high-fiber diet to help: ?Prevent constipation. Fiber can make your bowel movements more regular. ?Lower your cholesterol. ?Relieve the following conditions: ?Inflammation of veins in the anus (hemorrhoids). ?Inflammation of specific areas of the digestive tract (uncomplicated diverticulosis). ?A problem of the large intestine, also called the colon, that sometimes causes pain and diarrhea (irritable bowel syndrome, or IBS). ?Prevent overeating as part of a weight-loss plan. ?Prevent heart disease, type 2 diabetes, and certain cancers. ?What are tips for following this plan? ?Reading food labels ? ?Check the nutrition facts label on food products for the amount of dietary fiber. Choose foods that have 5 grams of fiber or more per serving. ?The goals for recommended daily fiber intake include: ?Men (age 53 or younger): 34-38 g. ?Men (over age 52): 28-34 g. ?Women (age 25 or younger): 25-28 g. ?Women (over age 8): 22-25 g. ?Your daily fiber goal is _____________ g. ?Shopping ?Choose whole fruits and vegetables instead of processed forms, such as apple juice or applesauce. ?Choose a wide variety of high-fiber foods such as avocados, lentils, oats, and kidney beans. ?Read the nutrition facts label of the foods you choose. Be aware of foods with added fiber. These foods often have high sugar and sodium amounts per serving. ?Cooking ?Use whole-grain flour for baking and cooking. ?Cook with brown rice instead of white rice. ?Meal planning ?Start the day with a breakfast that is high in fiber, such as a cereal that contains 5 g of fiber or more per serving. ?Eat breads and  cereals that are made with whole-grain flour instead of refined flour or white flour. ?Eat brown rice, bulgur wheat, or millet instead of white rice. ?Use beans in place of meat in soups, salads, and pasta dishes. ?Be sure that half of the grains you eat each day are whole grains. ?General information ?You can get the recommended daily intake of dietary fiber by: ?Eating a variety of fruits, vegetables, grains, nuts, and beans. ?Taking a fiber supplement if you are not able to take in enough fiber in your diet. It is better to get fiber through food than from a supplement. ?Gradually increase how much fiber you consume. If you increase your intake of dietary fiber too quickly, you may have bloating, cramping, or gas. ?Drink plenty of water to help you digest fiber. ?Choose high-fiber snacks, such as berries, raw vegetables, nuts, and popcorn. ?What foods should I eat? ?Fruits ?Berries. Pears. Apples. Oranges. Avocado. Prunes and raisins. Dried figs. ?Vegetables ?Sweet potatoes. Spinach. Kale. Artichokes. Cabbage. Broccoli. Cauliflower. Green peas. Carrots. Squash. ?Grains ?Whole-grain breads. Multigrain cereal. Oats and oatmeal. Brown rice. Barley. Bulgur wheat. [place]. Quinoa. Bran muffins. Popcorn. Rye wafer crackers. ?Meats and other proteins ?Navy beans, kidney beans, and pinto beans. Soybeans. Split peas. Lentils. Nuts and seeds. ?Dairy ?Fiber-fortified yogurt. ?Beverages ?Fiber-fortified soy milk. Fiber-fortified orange juice. ?Other foods ?Fiber bars. ?The items listed above may not be a complete list of recommended foods and beverages. Contact a dietitian for more information. ?What foods should I avoid? ?Fruits ?Fruit juice. Cooked, strained fruit. ?Vegetables ?Fried potatoes. Canned vegetables. Well-cooked vegetables. ?Grains ?White bread. Pasta made with refined flour. White rice. ?Meats  and other proteins ?Fatty cuts of meat. Fried chicken or fried fish. ?Dairy ?Milk. Yogurt. Cream cheese. Sour  cream. ?Fats and oils ?Butters. ?Beverages ?Soft drinks. ?Other foods ?Cakes and pastries. ?The items listed above may not be a complete list of foods and beverages to avoid. Talk with your dietitian about what choices are best for you. ?Summary ?Fiber is a type of carbohydrate. It is found in foods such as fruits, vegetables, whole grains, and beans. ?A high-fiber diet has many benefits. It can help to prevent constipation, lower blood cholesterol, aid weight loss, and reduce your risk of heart disease, diabetes, and certain cancers. ?Increase your intake of fiber gradually. Increasing fiber too quickly may cause cramping, bloating, and gas. Drink plenty of water while you increase the amount of fiber you consume. ?The best sources of fiber include whole fruits and vegetables, whole grains, nuts, seeds, and beans. ?This information is not intended to replace advice given to you by your health care provider. Make sure you discuss any questions you have with your health care provider. ?Document Revised: 03/23/2020 Document Reviewed: 03/23/2020 ?Elsevier Patient Education ? [place]. ? ?

## 2022-03-25 NOTE — Progress Notes (Signed)
?  ?Cephas Darby, MD ?[address]  ?[address]  ?[place], [place] [address]  ?Main: [phone]  ?Fax: [phone] ? ? ? ?Gastroenterology Consultation ? ?Referring Provider:     Hoyt Koch, * ?Primary Care Physician:  Hoyt Koch, MD ?Primary Gastroenterologist:  Dr. Cephas Darby ?Reason for Consultation:     Severe constipation, rectal bleeding ?      ? HPI:   ?Nicole Whitehead is a 41 y.o. female referred by Dr. Sharlet Salina, Real Cons, MD  for consultation & management of severe constipation.  Patient reports that for last 2 weeks, she has been having struggling to have a BM, finally had one today, she feels she is backed up, had to spend about 30 to 45 minutes on the toilet, stools are very hard Sometimes needing manual disimpaction she also reports rectal pain, discomfort, rectal bleeding, rectal pressure.  She is just started on Linzess 145 mcg daily which patient has not taken yet.  Patient reports that her diet mainly consists of meat, less amount of fiber.  She drinks apple juice daily.  No evidence of anemia.  No recent TSH levels done ?Patient does not smoke or drink alcohol ?She denies any family history of GI malignancy ? ?NSAIDs: None ? ?Antiplts/Anticoagulants/Anti thrombotics: None ? ?GI Procedures: None ? ?Past Medical History:  ?Diagnosis Date  ? ALLERGIC RHINITIS   ? CELLULITIS, THIGH, RIGHT 02/11/2011  ? MRSA  ? Eustachian tube dysfunction   ? Labial lesion 2000  ? 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 PAP SMEAR, ABNORMAL 2008  ? SCOLIOSIS, MILD   ? ? ?Past Surgical History:  ?Procedure Laterality Date  ? COLPOSCOPY  03/10/06  ? FOOT SURGERY    ? KNEE SURGERY    ? NO PAST SURGERIES    ? ?Current Outpatient Medications:  ?  Cholecalciferol (VITAMIN D3) 50 MCG (2000 UT) TABS, Take 1 tablet by mouth daily., Disp: , Rfl:  ?  hydrocortisone (ANUSOL-HC) 2.5 % rectal cream, Place 1 application. rectally 2 (two) times daily., Disp: 30 g,  Rfl: 0 ?  Multiple Vitamins-Minerals (HAIR VITAMINS) TABS, Take 1 tablet by mouth daily. , Disp: , Rfl:  ?  polyethylene glycol powder (GLYCOLAX/MIRALAX) 17 GM/SCOOP powder, Take 255 g by mouth daily., Disp: 255 g, Rfl: 0 ?  linaclotide (LINZESS) 145 MCG CAPS capsule, Take 1 capsule (145 mcg total) by mouth daily before breakfast. (Patient not taking: Reported on 03/25/2022), Disp: 30 capsule, Rfl: 3 ?  Na Sulfate-K Sulfate-Mg Sulf 17.5-3.13-1.6 GM/177ML SOLN, Take 354 mLs by mouth once for 1 dose., Disp: 708 mL, Rfl: 0 ? ? ?Family History  ?Problem Relation Age of Onset  ? Breast cancer Paternal Aunt 38  ?  ? ?Social History  ? ?Tobacco Use  ? Smoking status: Never  ? Smokeless tobacco: Never  ? Tobacco comments:  ?  Single, lives alone with her dog. worked at [employer] until 12/2012  ?Substance Use Topics  ? Alcohol use: No  ? Drug use: No  ? ? ?Allergies as of 03/25/2022 - Review Complete 03/25/2022  ?Allergen Reaction Noted  ? Hydrocodone-acetaminophen    ? Oxycodone-acetaminophen    ? Sulfa antibiotics  04/08/2007  ? Sulfamethoxazole-trimethoprim Other (See Comments) 04/08/2007  ? Sulfonamide derivatives    ? ? ?Review of Systems:    ?All systems reviewed and negative except where noted in HPI. ? ? Physical Exam:  ?BP (!) 165/103 (BP  Location: Left Arm, Patient Position: Sitting, Cuff Size: Normal)   Pulse 69   Temp 98.5 ?F (36.9 ?C) (Oral)   Ht '5\' 8"'$  (1.727 m)   Wt 221 lb 6 oz (100.4 kg)   LMP 02/23/2022   BMI 33.66 kg/m?  ?Patient's last menstrual period was 02/23/2022. ? ?General:   Alert,  Well-developed, well-nourished, pleasant and cooperative in NAD ?Head:  Normocephalic and atraumatic. ?Eyes:  Sclera clear, no icterus.   Conjunctiva pink. ?Ears:  Normal auditory acuity. ?Nose:  No deformity, discharge, or lesions. ?Mouth:  No deformity or lesions,oropharynx pink & moist. ?Neck:  Supple; no masses or thyromegaly. ?Lungs:  Respirations even and unlabored.  Clear throughout to auscultation.   No  wheezes, crackles, or rhonchi. No acute distress. ?Heart:  Regular rate and rhythm; no murmurs, clicks, rubs, or gallops. ?Abdomen:  Normal bowel sounds. Soft, non-tender and distended without masses, hepatosplenomegaly or hernias noted.  No guarding or rebound tenderness.   ?Rectal: Not performed ?Msk:  Symmetrical without gross deformities. Good, equal movement & strength bilaterally. ?Pulses:  Normal pulses noted. ?Extremities:  No clubbing or edema.  No cyanosis. ?Neurologic:  Alert and oriented x3;  grossly normal neurologically. ?Skin:  Intact without significant lesions or rashes. No jaundice. ?Psych:  Alert and cooperative. Normal mood and affect. ? ?Imaging Studies: ?Reviewed ? ?Assessment and Plan:  ? ?Anael Rosch Ishikawa is a 41 y.o. pleasant African-American female with no significant past medical history is seen in consultation for chronic severe constipation, rectal bleeding ? ?Chronic severe constipation ?Discussed about cleanout with MiraLAX prep, patient agreed ?Start Linzess 145 mcg daily after bowel cleanout ?Reiterated on high-fiber diet, information provided ?Advised patient to cut back on animal protein, sugary drinks including artificial sweeteners ?Recommend to check TSH levels ? ?Bright red blood per rectum ?Recommend diagnostic colonoscopy with 2-day prep ?Discussed about hemorrhoid banding based on the colonoscopy results ? ?Follow up based on the colonoscopy results ? ? ?Cephas Darby, MD ? ?

## 2022-03-26 ENCOUNTER — Ambulatory Visit: Payer: BC Managed Care – PPO | Admitting: Internal Medicine

## 2022-03-26 ENCOUNTER — Encounter: Payer: Self-pay | Admitting: Internal Medicine

## 2022-04-02 ENCOUNTER — Telehealth: Payer: Self-pay

## 2022-04-02 MED ORDER — ONDANSETRON HCL 4 MG PO TABS: 4.0000 mg | ORAL_TABLET | Freq: Three times a day (TID) | ORAL | 1 refills | Status: DC | PRN

## 2022-04-02 MED ORDER — NA SULFATE-K SULFATE-MG SULF 17.5-3.13-1.6 GM/177ML PO SOLN: 1.0000 | Freq: Once | ORAL | 0 refills | Status: AC

## 2022-04-02 NOTE — Addendum Note (Signed)
Addended by: Eliseo Squires on: 04/02/2022 12:51 PM ? ? Modules accepted: Orders ? ?

## 2022-04-02 NOTE — Telephone Encounter (Signed)
Patient needed another kit sent into pharmacy so sent in another kit and sent zofran for nausea ?

## 2022-04-04 ENCOUNTER — Ambulatory Visit: Payer: BC Managed Care – PPO | Admitting: Anesthesiology

## 2022-04-04 ENCOUNTER — Ambulatory Visit
Admission: RE | Admit: 2022-04-04 | Discharge: 2022-04-04 | Disposition: A | Discharge status: Home / Self Care | Payer: BC Managed Care – PPO | Attending: Gastroenterology | Admitting: Gastroenterology

## 2022-04-04 ENCOUNTER — Encounter
Admission: RE | Disposition: A | Discharge status: Home / Self Care | Payer: Self-pay | Source: Home / Self Care | Attending: Gastroenterology

## 2022-04-04 ENCOUNTER — Encounter: Payer: Self-pay | Admitting: Gastroenterology

## 2022-04-04 DIAGNOSIS — Z6832 Body mass index (BMI) 32.0-32.9, adult: Secondary | ICD-10-CM | POA: Insufficient documentation

## 2022-04-04 DIAGNOSIS — K625 Hemorrhage of anus and rectum: Secondary | ICD-10-CM | POA: Diagnosis not present

## 2022-04-04 DIAGNOSIS — Z85831 Personal history of malignant neoplasm of soft tissue: Secondary | ICD-10-CM | POA: Diagnosis not present

## 2022-04-04 DIAGNOSIS — E669 Obesity, unspecified: Secondary | ICD-10-CM | POA: Diagnosis not present

## 2022-04-04 HISTORY — PX: COLONOSCOPY WITH PROPOFOL: SHX5780

## 2022-04-04 LAB — POCT PREGNANCY, URINE: Preg Test, Ur: NEGATIVE

## 2022-04-04 SURGERY — COLONOSCOPY WITH PROPOFOL
Anesthesia: General

## 2022-04-04 MED ORDER — MIDAZOLAM HCL 2 MG/2ML IJ SOLN
INTRAMUSCULAR | Status: DC | PRN
Start: 2022-04-04 — End: 2022-04-04
  Administered 2022-04-04: 2 mg via INTRAVENOUS

## 2022-04-04 MED ORDER — MIDAZOLAM HCL 2 MG/2ML IJ SOLN
INTRAMUSCULAR | Status: AC
  Filled 2022-04-04: qty 2

## 2022-04-04 MED ORDER — SODIUM CHLORIDE 0.9 % IV SOLN: INTRAVENOUS | Status: DC

## 2022-04-04 MED ORDER — PROPOFOL 10 MG/ML IV BOLUS
INTRAVENOUS | Status: DC | PRN
Start: 2022-04-04 — End: 2022-04-04

## 2022-04-04 MED ORDER — PROPOFOL 500 MG/50ML IV EMUL
INTRAVENOUS | Status: DC | PRN
Start: 2022-04-04 — End: 2022-04-04
  Administered 2022-04-04: 155 ug/kg/min via INTRAVENOUS

## 2022-04-04 MED ORDER — PROPOFOL 10 MG/ML IV BOLUS
INTRAVENOUS | Status: DC | PRN
  Administered 2022-04-04: 60 mg via INTRAVENOUS
  Administered 2022-04-04 (×2): 10 mg via INTRAVENOUS

## 2022-04-04 NOTE — Anesthesia Preprocedure Evaluation (Addendum)
Anesthesia Evaluation  ?Patient identified by MRN, date of birth, ID band ?Patient awake ? ? ? ?Reviewed: ?Allergy & Precautions, NPO status , Patient's Chart, lab work & pertinent test results ? ?History of Anesthesia Complications ?Negative for: history of anesthetic complications ? ?Airway ?Mallampati: II ? ? ?Neck ROM: Full ? ? ? Dental ?no notable dental hx. ? ?  ?Pulmonary ?neg pulmonary ROS,  ?  ?Pulmonary exam normal ?breath sounds clear to auscultation ? ? ? ? ? ? Cardiovascular ?Exercise Tolerance: Good ?negative cardio ROS ?Normal cardiovascular exam ?Rhythm:Regular Rate:Normal ? ? ?  ?Neuro/Psych ?negative neurological ROS ?   ? GI/Hepatic ?negative GI ROS,   ?Endo/Other  ?Obesity  ? Renal/GU ?negative Renal ROS  ? ?  ?Musculoskeletal ?Leg leiomyosarcoma s/p resection 2005  ? Abdominal ?  ?Peds ? Hematology ?negative hematology ROS ?(+)   ?Anesthesia Other Findings ? ? Reproductive/Obstetrics ? ?  ? ? ? ? ? ? ? ? ? ? ? ? ? ?  ?  ? ? ? ? ? ? ? ?Anesthesia Physical ?Anesthesia Plan ? ?ASA: 2 ? ?Anesthesia Plan: General  ? ?Post-op Pain Management:   ? ?Induction: Intravenous ? ?PONV Risk Score and Plan: 3 and Propofol infusion, TIVA and Treatment may vary due to age or medical condition ? ?Airway Management Planned: Natural Airway ? ?Additional Equipment:  ? ?Intra-op Plan:  ? ?Post-operative Plan:  ? ?Informed Consent: I have reviewed the patients History and Physical, chart, labs and discussed the procedure including the risks, benefits and alternatives for the proposed anesthesia with the patient or authorized representative who has indicated his/her understanding and acceptance.  ? ? ? ? ? ?Plan Discussed with: CRNA ? ?Anesthesia Plan Comments: (LMA/GETA backup discussed.  Patient consented for risks of anesthesia including but not limited to:  ?- adverse reactions to medications ?- damage to eyes, teeth, lips or other oral mucosa ?- nerve damage due to positioning  ?-  sore throat or hoarseness ?- damage to heart, brain, nerves, lungs, other parts of body or loss of life ? ?Informed patient about role of CRNA in peri- and intra-operative care.  Patient voiced understanding.)  ? ? ? ? ? ? ?Anesthesia Quick Evaluation ? ?

## 2022-04-04 NOTE — Anesthesia Procedure Notes (Signed)
Procedure Name: General with mask airway ?Date/Time: 04/04/2022 10:14 AM ?Performed by: Kelton Pillar, CRNA ?Pre-anesthesia Checklist: Patient identified, Emergency Drugs available, Suction available and Patient being monitored ?Patient Re-evaluated:Patient Re-evaluated prior to induction ?Oxygen Delivery Method: Simple face mask ?Induction Type: IV induction ?Placement Confirmation: positive ETCO2, CO2 detector and breath sounds checked- equal and bilateral ?Dental Injury: Teeth and Oropharynx as per pre-operative assessment  ? ? ? ? ?

## 2022-04-04 NOTE — Transfer of Care (Signed)
Immediate Anesthesia Transfer of Care Note ? ?Patient: Nicole Whitehead ? ?Procedure(s) Performed: COLONOSCOPY WITH PROPOFOL ? ?Patient Location: Endoscopy Unit ? ?Anesthesia Type:General ? ?Level of Consciousness: drowsy and patient cooperative ? ?Airway & Oxygen Therapy: Patient Spontanous Breathing and Patient connected to face mask oxygen ? ?Post-op Assessment: Report given to RN and Post -op Vital signs reviewed and stable ? ?Post vital signs: Reviewed and stable ? ?Last Vitals:  ?Vitals Value Taken Time  ?BP 115/74 04/04/22 1021  ?Temp 36.1 ?C 04/04/22 1021  ?Pulse 68 04/04/22 1021  ?Resp 22 04/04/22 1021  ?SpO2 100 % 04/04/22 1021  ? ? ?Last Pain:  ?Vitals:  ? 04/04/22 1021  ?TempSrc: Temporal  ?PainSc: Asleep  ?   ? ?  ? ?Complications: No notable events documented. ?

## 2022-04-04 NOTE — Anesthesia Postprocedure Evaluation (Signed)
Anesthesia Post Note ? ?Patient: Nicole Whitehead ? ?Procedure(s) Performed: COLONOSCOPY WITH PROPOFOL ? ?Patient location during evaluation: PACU ?Anesthesia Type: General ?Level of consciousness: awake and alert, oriented and patient cooperative ?Pain management: pain level controlled ?Vital Signs Assessment: post-procedure vital signs reviewed and stable ?Respiratory status: spontaneous breathing, nonlabored ventilation and respiratory function stable ?Cardiovascular status: blood pressure returned to baseline and stable ?Postop Assessment: adequate PO intake ?Anesthetic complications: no ? ? ?No notable events documented. ? ? ?Last Vitals:  ?Vitals:  ? 04/04/22 1041 04/04/22 1046  ?BP: 133/82 123/89  ?Pulse: 63 60  ?Resp: 20 19  ?Temp:    ?SpO2: 100%   ?  ?Last Pain:  ?Vitals:  ? 04/04/22 1046  ?TempSrc:   ?PainSc: 0-No pain  ? ? ?  ?  ?  ?  ?  ?  ? ?Darrin Nipper ? ? ? ? ?

## 2022-04-04 NOTE — Op Note (Signed)
[facility] ?Gastroenterology ?Patient Name: Nicole Whitehead ?Procedure Date: 04/04/2022 9:58 AM ?MRN: [id number] ?Account #: [id number] ?Date of Birth: 06-12-1981 ?Admit Type: Outpatient ?Age: 41 ?Room: [facility] ENDO ROOM 3 ?Gender: Female ?Note Status: Finalized ?Instrument Name: Colonoscope [id number] ?Procedure:             Colonoscopy ?Indications:           This is the patient's first colonoscopy, Rectal  ?                       bleeding ?Providers:             Lin Landsman MD, MD ?Referring MD:          Real Cons. Sharlet Salina MD, MD (Referring MD) ?Medicines:             General Anesthesia ?Complications:         No immediate complications. Estimated blood loss: None. ?Procedure:             Pre-Anesthesia Assessment: ?                       - Prior to the procedure, a History and Physical was  ?                       performed, and patient medications and allergies were  ?                       reviewed. The patient is competent. The risks and  ?                       benefits of the procedure and the sedation options and  ?                       risks were discussed with the patient. All questions  ?                       were answered and informed consent was obtained.  ?                       Patient identification and proposed procedure were  ?                       verified by the physician, the nurse, the  ?                       anesthesiologist, the anesthetist and the technician  ?                       in the pre-procedure area in the procedure room in the  ?                       endoscopy suite. Mental Status Examination: alert and  ?                       oriented. Airway Examination: normal oropharyngeal  ?                       airway and neck mobility. Respiratory Examination:  ?  clear to auscultation. CV Examination: normal.  ?                       Prophylactic Antibiotics: The patient does not require  ?                       prophylactic antibiotics.  Prior Anticoagulants: The  ?                       patient has taken no previous anticoagulant or  ?                       antiplatelet agents. ASA Grade Assessment: II - A  ?                       patient with mild systemic disease. After reviewing  ?                       the risks and benefits, the patient was deemed in  ?                       satisfactory condition to undergo the procedure. The  ?                       anesthesia plan was to use general anesthesia.  ?                       Immediately prior to administration of medications,  ?                       the patient was re-assessed for adequacy to receive  ?                       sedatives. The heart rate, respiratory rate, oxygen  ?                       saturations, blood pressure, adequacy of pulmonary  ?                       ventilation, and response to care were monitored  ?                       throughout the procedure. The physical status of the  ?                       patient was re-assessed after the procedure. ?                       After obtaining informed consent, the colonoscope was  ?                       passed under direct vision. Throughout the procedure,  ?                       the patient's blood pressure, pulse, and oxygen  ?                       saturations were monitored continuously. The  ?  Colonoscope was introduced through the anus and  ?                       advanced to the the cecum, identified by appendiceal  ?                       orifice and ileocecal valve. The colonoscopy was  ?                       performed without difficulty. The patient tolerated  ?                       the procedure well. The quality of the bowel  ?                       preparation was evaluated using the BBPS [facility] Bowel  ?                       Preparation Scale) with scores of: Right Colon = 3,  ?                       Transverse Colon = 3 and Left Colon = 3 (entire mucosa  ?                       seen well  with no residual staining, small fragments  ?                       of stool or opaque liquid). The total BBPS score  ?                       equals 9. ?Findings: ?     The perianal and digital rectal examinations were normal. Pertinent  ?     negatives include normal sphincter tone and no palpable rectal lesions. ?     The entire examined colon appeared normal. ?     The retroflexed view of the distal rectum and anal verge was normal and  ?     showed no anal or rectal abnormalities. ?Impression:            - The entire examined colon is normal. ?                       - The distal rectum and anal verge are normal on  ?                       retroflexion view. ?                       - No specimens collected. ?Recommendation:        - Discharge patient to home (with escort). ?                       - Resume previous diet today. ?                       - Continue present medications. ?                       - Repeat colonoscopy in 10 years  for screening  ?                       purposes. ?Procedure Code(s):     --- Professional --- ?                       [id number], Colonoscopy, flexible; diagnostic, including  ?                       collection of specimen(s) by brushing or washing, when  ?                       performed (separate procedure) ?Diagnosis Code(s):     --- Professional --- ?                       K62.5, Hemorrhage of anus and rectum ?CPT copyright 2019 [employer]. All rights reserved. ?The codes documented in this report are preliminary and upon coder review may  ?be revised to meet current compliance requirements. ?Dr. Ulyess Mort ?Joneisha Miles Raeanne Gathers MD, MD ?04/04/2022 10:20:02 AM ?This report has been signed electronically. ?Number of Addenda: 0 ?Note Initiated On: 04/04/2022 9:58 AM ?Scope Withdrawal Time: 0 hours 4 minutes 15 seconds  ?Total Procedure Duration: 0 hours 6 minutes 22 seconds  ?Estimated Blood Loss:  Estimated blood loss: none. ?     [facility] ?

## 2022-04-04 NOTE — H&P (Signed)
?Cephas Darby, MD ?[address]  ?[address]  ?[place], [place] [address]  ?Main: [phone]  ?Fax: [phone] ?Pager: [phone] ? ?Primary Care Physician:  Hoyt Koch, MD ?Primary Gastroenterologist:  Dr. Cephas Darby ? ?Pre-Procedure History & Physical: ?HPI:  Nicole Whitehead is a 41 y.o. female is here for an colonoscopy. ?  ?Past Medical History:  ?Diagnosis Date  ? ALLERGIC RHINITIS   ? CELLULITIS, THIGH, RIGHT 02/11/2011  ? MRSA  ? Eustachian tube dysfunction   ? Labial lesion 2000  ? 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 left [facility]-s/p resection [facility]   ? PAP SMEAR, ABNORMAL 2008  ? SCOLIOSIS, MILD   ? ? ?Past Surgical History:  ?Procedure Laterality Date  ? COLPOSCOPY  03/10/2006  ? FOOT SURGERY    ? KNEE SURGERY    ? ? ?Prior to Admission medications   ?Medication Sig Start Date End Date Taking? Authorizing Provider  ?Cholecalciferol (VITAMIN D3) 50 MCG (2000 UT) TABS Take 1 tablet by mouth daily.    [provider]  ?hydrocortisone (ANUSOL-HC) 2.5 % rectal cream Place 1 application. rectally 2 (two) times daily. 03/22/22   Hoyt Koch, MD  ?linaclotide Rolan Lipa) 145 MCG CAPS capsule Take 1 capsule (145 mcg total) by mouth daily before breakfast. ?Patient not taking: Reported on 03/25/2022 03/22/22   Hoyt Koch, MD  ?Multiple Vitamins-Minerals (HAIR VITAMINS) TABS Take 1 tablet by mouth daily.     [provider]  ?ondansetron (ZOFRAN) 4 MG tablet Take 1 tablet (4 mg total) by mouth every 8 (eight) hours as needed for nausea or vomiting. 04/02/22   Lin Landsman, MD  ?ondansetron (ZOFRAN) 4 MG tablet Take 1 tablet (4 mg total) by mouth every 8 (eight) hours as needed for nausea or vomiting. 04/02/22   Lin Landsman, MD  ?polyethylene glycol powder (GLYCOLAX/MIRALAX) 17 GM/SCOOP powder Take 255 g by mouth daily. 03/25/22   Lin Landsman, MD  ? ? ?Allergies as of 03/26/2022 - Review Complete 03/25/2022  ?Allergen Reaction Noted  ?  Hydrocodone-acetaminophen    ? Oxycodone-acetaminophen    ? Sulfa antibiotics  04/08/2007  ? Sulfamethoxazole-trimethoprim Other (See Comments) 04/08/2007  ? Sulfonamide derivatives    ? ? ?Family History  ?Problem Relation Age of Onset  ? Breast cancer Paternal Aunt 70  ? ? ?Social History  ? ?Socioeconomic History  ? Marital status: Single  ?  Spouse name: Not on file  ? Number of children: 0  ? Years of education: 8  ? Highest education level: Not on file  ?Occupational History  ? Occupation: Salesperson  ?  Comment: [employer]  ?Tobacco Use  ? Smoking status: Never  ? Smokeless tobacco: Never  ? Tobacco comments:  ?  Single, lives alone with her dog. worked at [employer] until 12/2012  ?Substance and Sexual Activity  ? Alcohol use: No  ? Drug use: No  ? Sexual activity: Not Currently  ?  Birth control/protection: Condom  ?Other Topics Concern  ? Not on file  ?Social History Narrative  ? Not on file  ? ?Social Determinants of Health  ? ?Financial Resource Strain: Not on file  ?Food Insecurity: Not on file  ?Transportation Needs: Not on file  ?Physical Activity: Not on file  ?Stress: Not on file  ?Social Connections: Not on file  ?Intimate Partner Violence: Not on file  ? ? ?Review of Systems: ?See HPI, otherwise negative ROS ? ?Physical Exam: ?BP Marland Kitchen)  144/99   Pulse 64   Temp (!) 97.1 ?F (36.2 ?C) (Temporal)   Resp 16   Ht '5\' 8"'$  (1.727 m)   Wt 97.5 kg   SpO2 100%   BMI 32.69 kg/m?  ?General:   Alert,  pleasant and cooperative in NAD ?Head:  Normocephalic and atraumatic. ?Neck:  Supple; no masses or thyromegaly. ?Lungs:  Clear throughout to auscultation.    ?Heart:  Regular rate and rhythm. ?Abdomen:  Soft, nontender and nondistended. Normal bowel sounds, without guarding, and without rebound.   ?Neurologic:  Alert and  oriented x4;  grossly normal neurologically. ? ?Impression/Plan: ?Nicole Whitehead is here for an colonoscopy to be performed for rectal bleeding ? ?Risks, benefits, limitations,  and alternatives regarding  colonoscopy have been reviewed with the patient.  Questions have been answered.  All parties agreeable. ? ? ?Sherri Sear, MD  04/04/2022, 10:03 AM ?

## 2022-04-05 ENCOUNTER — Encounter: Payer: Self-pay | Admitting: Gastroenterology

## 2022-04-08 ENCOUNTER — Telehealth: Payer: Self-pay

## 2022-04-08 NOTE — Telephone Encounter (Signed)
Patient states she tried to get her PCP to fill out [facility] for her day of prep and colonoscopy. She states that she was unable to work the day before the colonoscopy due to taking the prep. She states and  then she was off the day of her colonoscopy. She had to take off the day after her colonoscopy also because she was still having diarrhea. Patient states that she was working this morning and got called off her job because she did not have the forms she needed for her job. Patient is asking if we can fill out the [facility] for 04/03/2022 through 04/05/2022. I did give her a letter that she was seen for colonoscopy on 04/04/2022. She states she can not lose her job and needs these forms filled out. Informed patient that provider was out of the office till Wednesday but would let her know as soon as I know something if we could fill them out  ?

## 2022-04-09 NOTE — Telephone Encounter (Signed)
Talk to Dr. Marius Ditch and she states yes we can do the paperwork for the procedure. Talk to patient and she states she was out from 03/21/22 to 04/05/2022. I documented this on the form but put when she was supposed to be out for our office also. Faxed paper work to her work and informed patient her copy was ready for pick up she states she will come pick this up  ?

## 2022-07-12 ENCOUNTER — Other Ambulatory Visit (HOSPITAL_COMMUNITY): Payer: Self-pay

## 2022-07-12 MED ORDER — SEMAGLUTIDE-WEIGHT MANAGEMENT 0.25 MG/0.5ML ~~LOC~~ SOAJ
0.2500 mg | SUBCUTANEOUS | 1 refills | Status: DC
  Filled 2022-07-12: qty 2, 28d supply, fill #0

## 2022-07-31 ENCOUNTER — Other Ambulatory Visit (HOSPITAL_COMMUNITY): Payer: Self-pay

## 2022-07-31 MED ORDER — WEGOVY 0.5 MG/0.5ML ~~LOC~~ SOAJ
0.5000 mg | SUBCUTANEOUS | 1 refills | Status: DC
  Filled 2022-07-31 – 2022-08-06 (×3): qty 2, 28d supply, fill #0

## 2022-08-06 ENCOUNTER — Other Ambulatory Visit (HOSPITAL_COMMUNITY): Payer: Self-pay

## 2022-08-17 ENCOUNTER — Ambulatory Visit (HOSPITAL_COMMUNITY)
Admission: EM | Admit: 2022-08-17 | Discharge: 2022-08-17 | Disposition: A | Discharge status: Home / Self Care | Payer: Federal, State, Local not specified - PPO | Attending: Internal Medicine | Admitting: Internal Medicine

## 2022-08-17 ENCOUNTER — Encounter (HOSPITAL_COMMUNITY): Payer: Self-pay | Admitting: Emergency Medicine

## 2022-08-17 DIAGNOSIS — J309 Allergic rhinitis, unspecified: Secondary | ICD-10-CM

## 2022-08-17 DIAGNOSIS — H6123 Impacted cerumen, bilateral: Secondary | ICD-10-CM | POA: Diagnosis not present

## 2022-08-17 DIAGNOSIS — R051 Acute cough: Secondary | ICD-10-CM

## 2022-08-17 MED ORDER — BENZONATATE 100 MG PO CAPS: 100.0000 mg | ORAL_CAPSULE | Freq: Three times a day (TID) | ORAL | 0 refills | Status: DC

## 2022-08-17 MED ORDER — FLUTICASONE PROPIONATE 50 MCG/ACT NA SUSP: 1.0000 | Freq: Every day | NASAL | 2 refills | Status: DC

## 2022-08-17 MED ORDER — CETIRIZINE HCL 10 MG PO TABS: 10.0000 mg | ORAL_TABLET | Freq: Every day | ORAL | 2 refills | Status: DC

## 2022-08-17 NOTE — Discharge Instructions (Addendum)
We flushed your ears out in the clinic today. You may use over-the-counter Debrox eardrops as directed in the future for earwax impactions or come to urgent care if you experience similar symptoms and have your ears cleaned out again.   Your cough is likely related to allergic rhinitis (allergies) and may be leftover from your recent viral illness.  Your lung exam is normal today.   I would like for you to start taking Tessalon Perles every 8 hours as needed for cough.  Start using cetirizine once daily 10 mg to dry up the postnasal drainage causing your cough.  Use Flonase nasal spray 1 puff in each nostril daily to dry up the nasal mucus in your nose that is leading to postnasal drainage and cough.   Follow-up with your primary care provider for ongoing symptoms.   If your symptoms do not improve in the next 4 to 5 days or become worse, please return to urgent care for possible imaging at that time.  If you develop any new or worsening symptoms or if your symptoms become severe, please go to the nearest emergency department for further evaluation.  I hope you feel better!

## 2022-08-17 NOTE — ED Triage Notes (Signed)
Pt reports that had persistent cough for a month that not relieved with any OTC and home remedies. Mainly nonproductive when does get up something is foamy clear.

## 2022-08-17 NOTE — ED Provider Notes (Signed)
[facility]    CSN: [id number] Arrival date & time: 08/17/22  1517      History   Chief Complaint Chief Complaint  Patient presents with  . Cough    HPI Nicole Whitehead is a 41 y.o. female.   Patient presents to urgent care for evaluation of dry cough for the last month.  She got sick in August with cold symptoms. Her cough improved initially after she was sick, but it has come back. She describes it as a "tickle in her chest". No known sick contacts. Gets worse during the end of the day/nighttime. Denies shortness of breath but states her chest becomes slightly tight sometimes after coughing. Cough is worse at rest and tends to improve when she is exercising. No blood in the mucous/phlegm but states it appears to be "foamy". She had asthma when she was a child but has not used an inhaler in years. She denies smoking/drug use. States "she feels fine" just has the persistent cough. No fever/chills, nausea, vomiting, dizziness, nasal congestion, ear pain, eye drainage, watery itchy eyes, neck pain, headache, blurry vision, or abdominal pain.  She has not attempted use of any over-the-counter medications prior to arrival urgent care.   Cough   Past Medical History:  Diagnosis Date  . ALLERGIC RHINITIS   . 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 Eustachian tube dysfunction   . Labial lesion 2000  . Leiomyosarcoma of leg ([facility] [facility]) 2005   left [facility]   . PAP SMEAR, ABNORMAL 2008  . SCOLIOSIS, MILD     Patient Active Problem List   Diagnosis Date Noted  . Rectal bleeding   . Blood in stool 03/22/2022  . Change in bowel habits 03/22/2022  . Routine general medical examination at a health care facility 10/24/2014  . SCOLIOSIS, MILD 12/22/2009  . ALLERGIC RHINITIS 07/28/2007  . PAP SMEAR, ABNORMAL 07/17/2007    Past Surgical History:  Procedure Laterality Date  . COLONOSCOPY WITH PROPOFOL N/A 04/04/2022   Procedure: COLONOSCOPY WITH  PROPOFOL;  Surgeon: Lin Landsman, MD;  Location: [facility] ENDOSCOPY;  Service: Gastroenterology;  Laterality: N/A;  . COLPOSCOPY  03/10/2006  . FOOT SURGERY    . KNEE SURGERY      OB History   No obstetric history on file.      Home Medications    Prior to Admission medications   Medication Sig Start Date End Date Taking? Authorizing Provider  benzonatate (TESSALON) 100 MG capsule Take 1 capsule (100 mg total) by mouth every 8 (eight) hours. 08/17/22  Yes Talbot Grumbling, FNP  cetirizine (ZYRTEC) 10 MG tablet Take 1 tablet (10 mg total) by mouth daily. 08/17/22  Yes Talbot Grumbling, FNP  fluticasone (FLONASE) 50 MCG/ACT nasal spray Place 1 spray into both nostrils daily. 08/17/22  Yes Talbot Grumbling, FNP  Cholecalciferol (VITAMIN D3) 50 MCG (2000 UT) TABS Take 1 tablet by mouth daily.    [provider]  hydrocortisone (ANUSOL-HC) 2.5 % rectal cream Place 1 application. rectally 2 (two) times daily. 03/22/22   Hoyt Koch, MD  linaclotide [facility]) 145 MCG CAPS capsule Take 1 capsule (145 mcg total) by mouth daily before breakfast. 03/22/22   Hoyt Koch, MD  Multiple Vitamins-Minerals (HAIR VITAMINS) TABS Take 1 tablet by mouth daily.     [provider]  ondansetron (ZOFRAN) 4 MG tablet Take 1 tablet (4 mg total) by mouth every 8 (eight) hours as needed for nausea  or vomiting. 04/02/22   Lin Landsman, MD  ondansetron (ZOFRAN) 4 MG tablet Take 1 tablet (4 mg total) by mouth every 8 (eight) hours as needed for nausea or vomiting. 04/02/22   Lin Landsman, MD  polyethylene glycol powder (GLYCOLAX/MIRALAX) 17 GM/SCOOP powder Take 255 g by mouth daily. 03/25/22   Lin Landsman, MD  Semaglutide-Weight Management [facility]) 0.5 MG/0.5ML SOAJ Inject 0.5 mg into the skin once a week. 07/31/22     Semaglutide-Weight Management 0.25 MG/0.5ML SOAJ Inject 0.25 mg into the skin once a week. 07/02/22       Family History Family History   Problem Relation Age of Onset  . Breast cancer Paternal Aunt 74    Social History Social History   Tobacco Use  . Smoking status: Never  . Smokeless tobacco: Never  . Tobacco comments:    Single, lives alone with her dog. worked at [employer] until 12/2012  Substance Use Topics  . Alcohol use: No  . Drug use: No     Allergies   Hydrocodone-acetaminophen, Oxycodone-acetaminophen, Sulfa antibiotics, Sulfamethoxazole-trimethoprim, and Sulfonamide derivatives   Review of Systems Review of Systems  Respiratory:  Positive for cough.   Per HPI   Physical Exam Triage Vital Signs ED Triage Vitals  Enc Vitals Group     BP 08/17/22 1618 136/86     Pulse Rate 08/17/22 1618 (!) 58     Resp 08/17/22 1618 17     Temp 08/17/22 1618 98.4 F (36.9 C)     Temp Source 08/17/22 1618 Oral     SpO2 08/17/22 1618 98 %     Weight --      Height --      Head Circumference --      Peak Flow --      Pain Score 08/17/22 1614 0     Pain Loc --      Pain Edu? --      Excl. in [place]? --    No data found.  Updated Vital Signs BP 136/86 (BP Location: Right Arm)   Pulse (!) 58   Temp 98.4 F (36.9 C) (Oral)   Resp 17   LMP 08/11/2022   SpO2 98%   Visual Acuity Right Eye Distance:   Left Eye Distance:   Bilateral Distance:    Right Eye Near:   Left Eye Near:    Bilateral Near:     Physical Exam Vitals and nursing note reviewed.  Constitutional:      Appearance: Normal appearance. She is not ill-appearing or toxic-appearing.     Comments: Very pleasant patient sitting on exam in position of comfort table in no acute distress.   HENT:     Head: Normocephalic and atraumatic.     Right Ear: Hearing and external ear normal. There is impacted cerumen.     Left Ear: Hearing and external ear normal. There is impacted cerumen.     Ears:     Comments: Bilateral cerumen impaction upon initial assessment.     Nose: Rhinorrhea present. Rhinorrhea is clear and purulent.     Right  Turbinates: Swollen and pale.     Left Turbinates: Swollen and pale.     Mouth/Throat:     Lips: Pink.     Mouth: Mucous membranes are moist.     Pharynx: Posterior oropharyngeal erythema present.     Comments: Moderate amount of clear/purulent postnasal drainage present to the posterior oropharynx.  Mild erythema to the posterior  oropharynx present.  No swelling or exudate. Eyes:     General: Lids are normal. Vision grossly intact. Gaze aligned appropriately.     Extraocular Movements: Extraocular movements intact.     Conjunctiva/sclera: Conjunctivae normal.  Cardiovascular:     Rate and Rhythm: Normal rate and regular rhythm.     Heart sounds: Normal heart sounds, S1 normal and S2 normal.  Pulmonary:     Effort: Pulmonary effort is normal. No respiratory distress.     Breath sounds: Normal breath sounds and air entry.  Abdominal:     Palpations: Abdomen is soft.  Musculoskeletal:     Cervical back: Neck supple.  Skin:    General: Skin is warm and dry.     Capillary Refill: Capillary refill takes less than 2 seconds.     Findings: No rash.  Neurological:     General: No focal deficit present.     Mental Status: She is alert and oriented to person, place, and time. Mental status is at baseline.     Cranial Nerves: No dysarthria or facial asymmetry.     Gait: Gait is intact.  Psychiatric:        Mood and Affect: Mood normal.        Speech: Speech normal.        Behavior: Behavior normal.        Thought Content: Thought content normal.        Judgment: Judgment normal.     UC Treatments / Results  Labs (all labs ordered are listed, but only abnormal results are displayed) Labs Reviewed - No data to display  EKG   Radiology No results found.  Procedures Procedures (including critical care time)  Medications Ordered in UC Medications - No data to display  Initial Impression / Assessment and Plan / UC Course  I have reviewed the triage vital signs and the nursing  notes.  Pertinent labs & imaging results that were available during my care of the patient were reviewed by me and considered in my medical decision making (see chart for details).   1.  Allergic rhinitis Symptoms and physical exam are consistent with allergic rhinitis etiology and postnasal drainage.  Deferred imaging today based on stable cardiopulmonary exam and hemodynamically stable vital signs.  Is otherwise asymptomatic and continues to state that she "feels fine".  Low suspicion for acute infectious process or post viral bacterial process to the lungs based on physical exam findings and history provided in clinic today.  Patient to begin using Tessalon Perles every 8 hours as needed for cough.  Begin using cetirizine once daily to dry up postnasal drainage causing cough.  Flonase once daily advised as well.  If symptoms do not improve in the next 4 to 5 days using above interventions she is to return to urgent care for reevaluation and possible imaging at that time.   2.  Bilateral cerumen impaction Patient's bilateral ears flushed in clinic today.  Upon reassessment, tympanic membranes appear normal and canals appear normal as well.  No signs of infection or middle ear effusion.  Patient may use Debrox eardrops over-the-counter as needed   Discussed physical exam and available lab work findings in clinic with patient.  Counseled patient regarding appropriate use of medications and potential side effects for all medications recommended or prescribed today. Discussed red flag signs and symptoms of worsening condition,when to call the PCP office, return to urgent care, and when to seek higher level of care in the emergency  department. Patient verbalizes understanding and agreement with plan. All questions answered. Patient discharged in stable condition.  Final Clinical Impressions(s) / UC Diagnoses   Final diagnoses:  Allergic rhinitis, unspecified seasonality, unspecified trigger  Acute cough   Bilateral impacted cerumen   Discharge Instructions   None    ED Prescriptions     Medication Sig Dispense Auth. Provider   fluticasone (FLONASE) 50 MCG/ACT nasal spray Place 1 spray into both nostrils daily. 16 g Joella Prince M, FNP   cetirizine (ZYRTEC) 10 MG tablet Take 1 tablet (10 mg total) by mouth daily. 30 tablet Joella Prince M, FNP   benzonatate (TESSALON) 100 MG capsule Take 1 capsule (100 mg total) by mouth every 8 (eight) hours. 21 capsule Talbot Grumbling, FNP      PDMP not reviewed this encounter.

## 2022-08-27 ENCOUNTER — Other Ambulatory Visit (HOSPITAL_COMMUNITY): Payer: Self-pay

## 2022-08-30 ENCOUNTER — Other Ambulatory Visit (HOSPITAL_COMMUNITY): Payer: Self-pay

## 2022-09-11 ENCOUNTER — Other Ambulatory Visit (HOSPITAL_COMMUNITY): Payer: Self-pay

## 2022-09-24 ENCOUNTER — Other Ambulatory Visit (HOSPITAL_COMMUNITY): Payer: Self-pay

## 2022-09-24 MED ORDER — WEGOVY 1 MG/0.5ML ~~LOC~~ SOAJ
0.5000 mL | SUBCUTANEOUS | 1 refills | Status: DC
  Filled 2022-09-24 – 2022-09-25 (×2): qty 2, 28d supply, fill #0

## 2022-09-25 ENCOUNTER — Other Ambulatory Visit (HOSPITAL_COMMUNITY): Payer: Self-pay

## 2022-09-30 ENCOUNTER — Other Ambulatory Visit (HOSPITAL_COMMUNITY): Payer: Self-pay

## 2022-10-01 ENCOUNTER — Ambulatory Visit: Payer: Federal, State, Local not specified - PPO | Admitting: Internal Medicine

## 2022-10-03 ENCOUNTER — Other Ambulatory Visit (HOSPITAL_COMMUNITY): Payer: Self-pay

## 2022-10-10 ENCOUNTER — Ambulatory Visit (INDEPENDENT_AMBULATORY_CARE_PROVIDER_SITE_OTHER): Payer: Federal, State, Local not specified - PPO

## 2022-10-10 ENCOUNTER — Encounter: Payer: Self-pay | Admitting: Internal Medicine

## 2022-10-10 ENCOUNTER — Ambulatory Visit: Payer: Federal, State, Local not specified - PPO | Admitting: Internal Medicine

## 2022-10-10 VITALS — BP 120/86 | HR 83 | Temp 98.3°F | Ht 68.0 in | Wt 212.5 lb

## 2022-10-10 DIAGNOSIS — K625 Hemorrhage of anus and rectum: Secondary | ICD-10-CM | POA: Diagnosis not present

## 2022-10-10 DIAGNOSIS — R053 Chronic cough: Secondary | ICD-10-CM | POA: Insufficient documentation

## 2022-10-10 MED ORDER — AZITHROMYCIN 250 MG PO TABS: ORAL_TABLET | ORAL | 0 refills | Status: AC

## 2022-10-10 NOTE — Progress Notes (Signed)
   Subjective:   Patient ID: Nicole Whitehead, female    DOB: [birth year], 41 y.o.   MRN: [id number]  Cough Pertinent negatives include no chest pain or shortness of breath.   The patient is a 41 YO female coming in for follow up anal bleeding and new chronic cough.  Review of Systems  Constitutional: Negative.   HENT: Negative.    Eyes: Negative.   Respiratory:  Positive for cough. Negative for chest tightness and shortness of breath.   Cardiovascular:  Negative for chest pain, palpitations and leg swelling.  Gastrointestinal:  Positive for anal bleeding and constipation. Negative for abdominal distention, abdominal pain, diarrhea, nausea and vomiting.  Musculoskeletal: Negative.   Skin: Negative.   Neurological: Negative.   Psychiatric/Behavioral: Negative.      Objective:  Physical Exam Constitutional:      Appearance: She is well-developed.  HENT:     Head: Normocephalic and atraumatic.  Cardiovascular:     Rate and Rhythm: Normal rate and regular rhythm.  Pulmonary:     Effort: Pulmonary effort is normal. No respiratory distress.     Breath sounds: Rhonchi present. No wheezing or rales.     Comments: Rhonchi RLL Abdominal:     General: Bowel sounds are normal. There is no distension.     Palpations: Abdomen is soft.     Tenderness: There is no abdominal tenderness. There is no rebound.  Musculoskeletal:     Cervical back: Normal range of motion.  Skin:    General: Skin is warm and dry.  Neurological:     Mental Status: She is alert and oriented to person, place, and time.     Coordination: Coordination normal.     Vitals:   10/10/22 0759  BP: 120/86  Pulse: 83  Temp: 98.3 F (36.8 C)  TempSrc: Oral  SpO2: 92%  Weight: 212 lb 8 oz (96.4 kg)  Height: '5\' 8"'$  (1.727 m)    Assessment & Plan:

## 2022-10-10 NOTE — Assessment & Plan Note (Signed)
Cough started in August when she felt she had covid-19 but did not test. Never resolved. Seen in ER and given flonase and zyrtec and tessalon perles which has not helped. She has new job with mold exposure likely and this has exacerbated her cough every time she is there. Checking CXR today and rx azithromycin to cover rhonchi on exam.

## 2022-10-10 NOTE — Assessment & Plan Note (Signed)
Referral to GI for second opinion. She recently saw GI and had colonoscopy and they did not help her. She feels and I agree she likely has hemorrhoids and desires hemorrhoid treatment. She is having chronic constipation and is working on diet to help but not consistent with this yet.

## 2022-10-10 NOTE — Patient Instructions (Signed)
We have sent in azithromycin to take 2 pills day 1 then 1 pill daily days 2-5.   We will do the chest x-ray and get you in for a second opinion about the hemorrhoids.

## 2022-10-27 ENCOUNTER — Other Ambulatory Visit: Payer: Self-pay | Admitting: Internal Medicine

## 2022-10-28 ENCOUNTER — Other Ambulatory Visit: Payer: Self-pay

## 2022-10-28 MED ORDER — HAIR VITAMINS PO TABS
1.0000 | ORAL_TABLET | Freq: Every day | ORAL | 0 refills | Status: DC
  Filled 2022-10-28: qty 30, 30d supply, fill #0

## 2022-10-29 ENCOUNTER — Other Ambulatory Visit (HOSPITAL_COMMUNITY): Payer: Self-pay

## 2022-11-13 ENCOUNTER — Telehealth: Payer: Self-pay | Admitting: Gastroenterology

## 2022-11-13 NOTE — Telephone Encounter (Signed)
Hi Dr. Silverio Decamp,  (Supervising Doc 11/13/22 a.m.)  We received a referral for this patient who is asking for a "second opinion."  She recently had a colonoscopy (05/23) with [facility].  She is still having bleeding from her rectum and she says no one there can seem to tell her why, which is why she is asking to come here to be seen.  Her procedure report is in Epic.  Please let me know how you would like to proceed.  Thank you.

## 2022-11-14 ENCOUNTER — Encounter: Payer: Self-pay | Admitting: Gastroenterology

## 2022-11-14 NOTE — Telephone Encounter (Signed)
Okay to schedule next available appointment with me or app.  Thanks

## 2023-01-17 ENCOUNTER — Encounter: Payer: Self-pay | Admitting: Gastroenterology

## 2023-01-17 ENCOUNTER — Ambulatory Visit (INDEPENDENT_AMBULATORY_CARE_PROVIDER_SITE_OTHER): Payer: Federal, State, Local not specified - PPO | Admitting: Gastroenterology

## 2023-01-17 VITALS — BP 126/68 | HR 75 | Ht 68.0 in | Wt 196.0 lb

## 2023-01-17 DIAGNOSIS — K581 Irritable bowel syndrome with constipation: Secondary | ICD-10-CM

## 2023-01-17 DIAGNOSIS — K602 Anal fissure, unspecified: Secondary | ICD-10-CM

## 2023-01-17 MED ORDER — LINACLOTIDE 72 MCG PO CAPS: 72.0000 ug | ORAL_CAPSULE | Freq: Every day | ORAL | 3 refills | Status: DC

## 2023-01-17 MED ORDER — AMBULATORY NON FORMULARY MEDICATION: 1 refills | Status: DC

## 2023-01-17 NOTE — Patient Instructions (Addendum)
We have sent a prescription for nitroglycerin 0.125% gel to [facility]. You should apply a pea size amount to your rectum three times daily x 6-8 weeks.  [facility]'s information is below: Address: [address], [place], [place] [address]  [phone]  *Please DO NOT go directly from our office to pick up this medication! Give the pharmacy 1 day to process the prescription as this is compounded and takes time to make.   We have sent the following medications to your pharmacy for you to pick up at your convenience:  Linzess  Use Benefiber 1 tablespoon twice a day with meals  Anal Fissure, Adult  An anal fissure is a small tear or crack in the tissue of the anus. Bleeding from a fissure usually stops on its own within a few minutes. However, bleeding will often occur again with each bowel movement until the fissure heals. What are the causes? This condition is usually caused by passing a large or hard stool (feces). Other causes include: Constipation. Frequent diarrhea. Inflammatory bowel disease (Crohn's disease or ulcerative colitis). Childbirth. Infections. Anal sex. What are the signs or symptoms? Symptoms of this condition include: Bleeding from the rectum. Small amounts of blood seen on your stool, on the toilet paper, or in the toilet after a bowel movement. The blood coats the outside of the stool and is not mixed with the stool. Painful bowel movements. Itching or irritation around the anus. How is this diagnosed? A health care provider may diagnose this condition by closely examining the anal area. An anal fissure can usually be seen with careful inspection. In some cases, a rectal exam may be performed, or a short tube (anoscope) may be used to examine the anal canal. How is this treated? Initial treatment for this condition may include: Taking steps to avoid constipation. This may include making changes to your diet, such as increasing your  intake of fiber or fluid. Taking fiber supplements. These supplements can soften your stool to help make bowel movements easier. Your health care provider may also prescribe a stool softener if your stool is hard. Taking sitz baths. This may help to heal the tear. Using medicated creams or ointments. These may be prescribed to lessen discomfort. Treatments that are sometimes used if initial treatments do not work well or if the condition is more severe may include: Botulinum injection. Surgery to repair the fissure. Follow these instructions at home: Eating and drinking  Avoid foods that may cause constipation, such as bananas, milk, and other dairy products. Eat all fruits, except bananas. Drink enough fluid to keep your urine pale yellow. Eat foods that are high in fiber, such as beans, whole grains, and fresh fruits and vegetables. General instructions  Take over-the-counter and prescription medicines only as told by your health care provider. Use creams or ointments only as told by your health care provider. Keep the anal area clean and dry. Take sitz baths as told by your health care provider. Do not use soap in the sitz baths. Keep all follow-up visits as told by your health care provider. This is important. Contact a health care provider if you have: More bleeding. A fever. Diarrhea that is mixed with blood. Pain that continues. Ongoing problems that are getting worse rather than better. Summary An anal fissure is a small tear or crack in the tissue of the anus. This condition is usually caused by passing a large or hard stool (feces). Other causes include constipation and  frequent diarrhea. Initial treatment for this condition may include taking steps to avoid constipation, such as increasing your intake of fiber or fluid. Follow instructions for care as told by your health care provider. Contact your health care provider if you have more bleeding or your problem is getting  worse rather than better. Keep all follow-up visits as told by your health care provider. This is important. This information is not intended to replace advice given to you by your health care provider. Make sure you discuss any questions you have with your health care provider. Document Revised: 06/14/2021 Document Reviewed: 06/14/2021 Elsevier Patient Education  [place].   Follow up in 3 months  Thank you for choosing [facility]  Kavitha Nandigam,MD

## 2023-01-17 NOTE — Progress Notes (Signed)
Nicole Whitehead    HA:[id number]    26-Jan-1981  Primary Care Physician:Crawford, Real Cons, MD  Referring Physician: Hoyt Koch, MD [place],  [place] [id number]   Chief complaint: Rectal discomfort, constipation  HPI: 42 year old very pleasant female here for new patient visit with complaints of intermittent rectal bleeding and rectal pain on and off for past 1 year. She underwent colonoscopy last year, was normal.  She has not had any rectal bleeding recently but continues to have intermittent rectal pressure associated with pain and discomfort  She has irregular habits, on average has a bowel movement every 2 to 3 days.  She is able to have a bowel movement if she does use protein shake with herbal medicine for weight loss, does not take it every day. She is trying to eat healthy.  She works out regularly Denies any back injury or trauma.  colonoscopy Apr 04, 2022 by Dr. Marius Ditch at [facility] - The entire examined colon is normal.  - The distal rectum and anal verge are normal on retroflexion view.  Outpatient Encounter Medications as of 01/17/2023  Medication Sig   Semaglutide-Weight Management (WEGOVY) 2.4 MG/0.75ML SOAJ Inject 2.4 mg into the skin.   [DISCONTINUED] Multiple Vitamins-Minerals (HAIR VITAMINS) TABS Take 1 tablet by mouth daily.   [DISCONTINUED] cetirizine (ZYRTEC) 10 MG tablet Take 1 tablet (10 mg total) by mouth daily.   [DISCONTINUED] fluticasone (FLONASE) 50 MCG/ACT nasal spray Place 1 spray into both nostrils daily. (Patient not taking: Reported on 10/10/2022)   [DISCONTINUED] Semaglutide-Weight Management (WEGOVY) 0.5 MG/0.5ML SOAJ Inject 0.5 mg into the skin once a week.   [DISCONTINUED] Semaglutide-Weight Management (WEGOVY) 1 MG/0.5ML SOAJ Inject 1 mg into the skin once a week.   [DISCONTINUED] Semaglutide-Weight Management 0.25 MG/0.5ML SOAJ Inject 0.25 mg into the skin once a week.   No facility-administered encounter  medications on file as of 01/17/2023.    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 [facility]    PAP SMEAR, ABNORMAL 2008   SCOLIOSIS, MILD     Past Surgical History:  Procedure Laterality Date   COLONOSCOPY WITH PROPOFOL N/A 04/04/2022   Procedure: COLONOSCOPY WITH PROPOFOL;  Surgeon: Lin Landsman, MD;  Location: [facility] ENDOSCOPY;  Service: Gastroenterology;  Laterality: N/A;   COLPOSCOPY  03/10/2006   FOOT SURGERY     KNEE SURGERY      Family History  Problem Relation Age of Onset   Breast cancer Paternal Aunt 34    Social History   Socioeconomic History   Marital status: Single    Spouse name: Not on file   Number of children: 0   Years of education: 16   Highest education level: Not on file  Occupational History   Occupation: Salesperson    Comment: Public librarian  Tobacco Use   Smoking status: Never   Smokeless tobacco: Never   Tobacco comments:    Single, lives alone with her dog. worked at [employer] until  12/2012  Substance and Sexual Activity   Alcohol use: No   Drug use: No   Sexual activity: Not Currently    Birth control/protection: Condom  Other Topics Concern   Not on file  Social History Narrative   Not on file   Social Determinants of Health   Financial Resource Strain: Not on file  Food Insecurity: Not on file  Transportation Needs: Not on file  Physical Activity: Not on file  Stress: Not on file  Social Connections: Not on file  Intimate Partner Violence: Not on file      Review of  systems: All other review of systems negative except as mentioned in the HPI.   Physical Exam: Vitals:   01/17/23 0901  BP: 126/68  Pulse: 75   Body mass index is 29.8 kg/m. Gen:      No acute distress HEENT:  sclera anicteric CV: S1S2 Lungs: clear Abd:      soft, non-tender; no palpable masses, no distension Ext:    No edema Neuro: alert and oriented x 3 Psych: normal mood and affect Rectal exam: Increased anal sphincter tone with tenderness, posterior and anterior anal fissures.  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 The patient agreed with the plan and demonstrated an understanding of the instructions.  Nicole Whitehead , MD    CC: Hoyt Koch, *

## 2023-01-20 ENCOUNTER — Telehealth: Payer: Self-pay | Admitting: Gastroenterology

## 2023-01-20 NOTE — Telephone Encounter (Signed)
Patient is calling wondering if Dr Silverio Decamp has heard from her PCP regarding her Linzess Rx states she has not started the trial Linzess due to not knowing if the Rx will be approved. Please advise

## 2023-01-20 NOTE — Telephone Encounter (Signed)
The pt has been advised that Dr Silverio Decamp sent her Linzess to her pharmacy. She will call the pharmacy and see if it is available. She will let us know if there are any issues.

## 2023-01-23 ENCOUNTER — Telehealth: Payer: Self-pay | Admitting: Gastroenterology

## 2023-01-23 NOTE — Telephone Encounter (Signed)
PT is calling because [employer] needs a prior authorization for Linzess. [id number] and chose option "do by phone". Please advise.

## 2023-01-23 NOTE — Telephone Encounter (Signed)
Prior authorization is needed for Linzess.

## 2023-01-31 ENCOUNTER — Other Ambulatory Visit (HOSPITAL_COMMUNITY): Payer: Self-pay

## 2023-01-31 NOTE — Telephone Encounter (Signed)
Inbound call from patient, calling to follow up on prior authorization. Patient states she has not heard anything back. Please advise. Patient also states she would like some samples given to her while she waits for authorization.

## 2023-01-31 NOTE — Telephone Encounter (Signed)
Received notification from [facility] regarding a prior authorization for  Linzess . Authorization has been APPROVED from 01/01/23 to 01/31/24.   Authorization # Key: [id number] - PA Case ID: [id number]

## 2023-01-31 NOTE — Telephone Encounter (Signed)
Has this been started?

## 2023-04-29 ENCOUNTER — Ambulatory Visit (INDEPENDENT_AMBULATORY_CARE_PROVIDER_SITE_OTHER): Payer: Federal, State, Local not specified - PPO | Admitting: Internal Medicine

## 2023-04-29 ENCOUNTER — Encounter: Payer: Self-pay | Admitting: Internal Medicine

## 2023-04-29 VITALS — BP 136/86 | HR 62 | Temp 98.2°F | Ht 68.0 in | Wt 189.0 lb

## 2023-04-29 DIAGNOSIS — K625 Hemorrhage of anus and rectum: Secondary | ICD-10-CM

## 2023-04-29 DIAGNOSIS — Z0000 Encounter for general adult medical examination without abnormal findings: Secondary | ICD-10-CM | POA: Diagnosis not present

## 2023-04-29 LAB — HM MAMMOGRAPHY

## 2023-04-29 NOTE — Assessment & Plan Note (Signed)
Flu shot yearly. Tetanus up to date. Colonoscopy up to date. Mammogram up to date, pap smear up to date. Counseled about sun safety and mole surveillance. Counseled about the dangers of distracted driving. Given 10 year screening recommendations.   

## 2023-04-29 NOTE — Progress Notes (Signed)
   Subjective:   Patient ID: Nicole Whitehead, female    DOB: [birth year], 42 y.o.   MRN: [id number]  HPI The patient is here for physical.  PMH, [facility], social history reviewed and updated  Review of Systems  Constitutional: Negative.   HENT: Negative.    Eyes: Negative.   Respiratory:  Negative for cough, chest tightness and shortness of breath.   Cardiovascular:  Negative for chest pain, palpitations and leg swelling.  Gastrointestinal:  Negative for abdominal distention, abdominal pain, constipation, diarrhea, nausea and vomiting.  Musculoskeletal: Negative.   Skin: Negative.   Neurological: Negative.   Psychiatric/Behavioral: Negative.      Objective:  Physical Exam Constitutional:      Appearance: She is well-developed.  HENT:     Head: Normocephalic and atraumatic.  Cardiovascular:     Rate and Rhythm: Normal rate and regular rhythm.  Pulmonary:     Effort: Pulmonary effort is normal. No respiratory distress.     Breath sounds: Normal breath sounds. No wheezing or rales.  Abdominal:     General: Bowel sounds are normal. There is no distension.     Palpations: Abdomen is soft.     Tenderness: There is no abdominal tenderness. There is no rebound.  Musculoskeletal:     Cervical back: Normal range of motion.  Skin:    General: Skin is warm and dry.  Neurological:     Mental Status: She is alert and oriented to person, place, and time.     Coordination: Coordination normal.     Vitals:   04/29/23 0848 04/29/23 0851 04/29/23 0905  BP: (!) 140/100 (!) 140/100 136/86  Pulse: 62    Temp: 98.2 F (36.8 C)    TempSrc: Oral    SpO2: 97%    Weight: 189 lb (85.7 kg)    Height: 5\' 8"  (1.727 m)      Assessment & Plan:

## 2023-04-29 NOTE — Assessment & Plan Note (Signed)
Still using nitro prn and stopping linzess as not working. She is following up with GI soon.

## 2023-05-07 ENCOUNTER — Encounter: Payer: Self-pay | Admitting: Internal Medicine

## 2023-07-04 ENCOUNTER — Ambulatory Visit: Payer: Federal, State, Local not specified - PPO | Admitting: Physician Assistant

## 2023-07-04 ENCOUNTER — Encounter: Payer: Self-pay | Admitting: Physician Assistant

## 2023-07-04 VITALS — BP 102/70 | HR 72 | Ht 66.0 in | Wt 183.4 lb

## 2023-07-04 DIAGNOSIS — K5909 Other constipation: Secondary | ICD-10-CM | POA: Diagnosis not present

## 2023-07-04 DIAGNOSIS — Z8719 Personal history of other diseases of the digestive system: Secondary | ICD-10-CM

## 2023-07-04 DIAGNOSIS — K602 Anal fissure, unspecified: Secondary | ICD-10-CM

## 2023-07-04 MED ORDER — LUBIPROSTONE 24 MCG PO CAPS: 24.0000 ug | ORAL_CAPSULE | Freq: Two times a day (BID) | ORAL | 3 refills | Status: DC

## 2023-07-04 NOTE — Patient Instructions (Addendum)
We have sent the following medications to your pharmacy for you to pick up at your convenience: Amitiza  _______________________________________________________  If your blood pressure at your visit was 140/90 or greater, please contact your primary care physician to follow up on this.  _______________________________________________________  If you are age 42 or older, your body mass index should be between 23-30. Your Body mass index is 29.6 kg/m. If this is out of the aforementioned range listed, please consider follow up with your Primary Care Provider.  If you are age 58 or younger, your body mass index should be between 19-25. Your Body mass index is 29.6 kg/m. If this is out of the aformentioned range listed, please consider follow up with your Primary Care Provider.   ________________________________________________________  The [facility] providers would like to encourage you to use [facility] to communicate with providers for non-urgent requests or questions.  Due to long hold times on the telephone, sending your provider a message by [facility] may be a faster and more efficient way to get a response.  Please allow 48 business hours for a response.  Please remember that this is for non-urgent requests.  _______________________________________________________   I appreciate the  opportunity to care for you  Thank You   Jacelyn Grip

## 2023-07-04 NOTE — Progress Notes (Signed)
Chief Complaint: Follow-up anal fissure and constipation  HPI:    Nicole Whitehead is a 42 year old African-American female with a past medical history as listed below, known to Dr. Lavon Paganini, who presents to clinic today for follow-up of anal fissure.    04/04/2022 colonoscopy normal.    01/17/2023 patient seen in clinic by Dr. Lavon Paganini for rectal discomfort and constipation.  At that time thought to have recurrent anal fissure and started on 0.125% nitroglycerin per rectum 3-4 times daily for 8 weeks and told to start a fiber supplement.  Also given samples of Linzess 72 mcg daily.    Today, the patient tells me that back in April she felt like she had another episode of bleeding and pain and used her Nitroglycerin for a week and everything got better.  She has not felt any pain since then.  Tells me she did try the Linzess but this made her stomach feel bad with cramping and pain and never produced a bowel movement.  Right now the only way she has a bowel movement is when she takes a bowel cleanse about once a week.  Denies any other acute changes in symptoms.    Denies fever, chills or weight loss.  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   left [facility]-s/p [facility]    PAP SMEAR, ABNORMAL 2008   SCOLIOSIS, MILD     Past Surgical History:  Procedure Laterality Date   COLONOSCOPY WITH PROPOFOL N/A 04/04/2022   Procedure: COLONOSCOPY WITH PROPOFOL;  Surgeon: Toney Reil, MD;  Location: [facility] ENDOSCOPY;  Service: Gastroenterology;  Laterality: N/A;   COLPOSCOPY  03/10/2006   FOOT SURGERY     KNEE SURGERY      Current Outpatient Medications  Medication Sig Dispense Refill   AMBULATORY NON FORMULARY MEDICATION Medication Name: Nitroglycerin ointment 0.125% use pea sized amount 3-4 times a day per rectum x 8 weeks 30 g 1    Semaglutide-Weight Management (WEGOVY) 2.4 MG/0.75ML SOAJ Inject 2.4 mg into the skin.     No current facility-administered medications for this visit.    Allergies as of 07/04/2023 - Review Complete 04/29/2023  Allergen Reaction Noted   Hydrocodone-acetaminophen     Oxycodone-acetaminophen     Sulfa antibiotics  04/08/2007   Sulfamethoxazole-trimethoprim Other (See Comments) 04/08/2007   Sulfonamide derivatives      Family History  Problem Relation Age of Onset   Breast cancer Paternal Aunt 36    Social History   Socioeconomic History   Marital status: Single    Spouse name: Not on file   Number of children: 0   Years of education: 16   Highest education level: Not on file  Occupational History   Occupation: Salesperson    Comment: Estate agent  Tobacco Use   Smoking status: Never   Smokeless tobacco: Never   Tobacco comments:    Single, lives alone with her dog. worked at [employer] [place] until 12/2012  Substance and Sexual Activity   Alcohol use: No   Drug use: No   Sexual activity: Not Currently    Birth control/protection: Condom  Other Topics Concern   Not on file  Social History Narrative   Not on file   Social Determinants of Health   Financial Resource Strain: Not on file  Food Insecurity: Not  on file  Transportation Needs: Not on file  Physical Activity: Not on file  Stress: Not on file  Social Connections: Unknown (04/01/2022)   Received [facility]   Social Network    Social Network: Not on file  Intimate Partner Violence: Unknown (03/07/2022)   Received from [facility]   HITS    Physically Hurt: Not on file    Insult or Talk Down To: Not on file    Threaten Physical Harm: Not on file    Scream or Curse: Not on file    Review of Systems:    Constitutional: No weight loss, fever or chills Cardiovascular: No chest pain  Respiratory: No SOB Gastrointestinal: See HPI and otherwise negative   Physical Exam:  Vital signs: BP 102/70  (BP Location: Left Arm, Patient Position: Sitting, Cuff Size: Normal)   Pulse 72   Ht 5\' 6"  (1.676 m) Comment: height measured without shoes  Wt 183 lb 6 oz (83.2 kg)   LMP 06/18/2023   BMI 29.60 kg/m    Constitutional:   Pleasant AA female appears to be in NAD, Well developed, Well nourished, alert and cooperative  Respiratory: Respirations even and unlabored. Lungs clear to auscultation bilaterally.   No wheezes, crackles, or rhonchi.  Cardiovascular: Normal S1, S2. No MRG. Regular rate and rhythm. No peripheral edema, cyanosis or pallor.  Gastrointestinal:  Soft, nondistended, nontender. No rebound or guarding. Normal bowel sounds. No appreciable masses or hepatomegaly. Rectal:  External: healing posterior fissure Psychiatric: Demonstrates good judgement and reason without abnormal affect or behaviors.  RELEVANT LABS AND IMAGING: CBC    Component Value Date/Time   WBC 4.9 02/06/2022 0941   RBC 4.83 02/06/2022 0941   HGB 13.9 02/06/2022 0941   HCT 39.4 02/06/2022 0941   PLT 251.0 02/06/2022 0941   MCV 81.5 02/06/2022 0941   MCH 28.1 02/23/2020 2301   MCHC 35.2 02/06/2022 0941   RDW 14.8 02/06/2022 0941   LYMPHSABS 2.3 01/20/2013 1605   MONOABS 0.5 01/20/2013 1605   EOSABS 0.1 01/20/2013 1605   BASOSABS 0.0 01/20/2013 1605    CMP     Component Value Date/Time   NA 140 02/06/2022 0941   K 4.0 02/06/2022 0941   CL 104 02/06/2022 0941   CO2 27 02/06/2022 0941   GLUCOSE 83 02/06/2022 0941   BUN 10 02/06/2022 0941   CREATININE 0.88 02/06/2022 0941   CALCIUM 9.2 02/06/2022 0941   PROT 7.1 02/06/2022 0941   ALBUMIN 4.2 02/06/2022 0941   AST 18 02/06/2022 0941   ALT 17 02/06/2022 0941   ALKPHOS 49 02/06/2022 0941   BILITOT 0.4 02/06/2022 0941   GFRNONAA >60 02/23/2020 2301   GFRAA >60 02/23/2020 2301    Assessment: 1.  History of anal fissure: Healed today at time of exam 2.  Chronic constipation: No better with Linzess 72 mcg daily which actually gave her some  abdominal pain, she has tried MiraLAX in the past which does not help, fiber supplements do not help, currently taking a bowel cleanse about once a week, up-to-date on a colonoscopy; likely slow transit/IBS-C  Plan: 1.  Will try the patient on Amitiza 24 mcg twice daily with meals #60 with 5 refills. 2.  Patient can use the Nitroglycerin as needed.  Currently anal fissure is healed. 3.  Patient to follow in clinic with [place] in 3 to 4 months.  She can call and cancel if not needed.  She will let me know how she is doing  in the interim.  Hyacinth Meeker, PA-C [facility] 07/04/2023, 9:05 AM  Cc: Myrlene Broker, *

## 2023-07-07 ENCOUNTER — Telehealth: Payer: Self-pay | Admitting: Pharmacy Technician

## 2023-07-07 ENCOUNTER — Other Ambulatory Visit (HOSPITAL_COMMUNITY): Payer: Self-pay

## 2023-07-07 NOTE — Telephone Encounter (Signed)
Pharmacy Patient Advocate Encounter   Received notification from CoverMyMeds that prior authorization for LUBIPROSTONE is required/requested.   Insurance verification completed.   The patient is insured through [facility] .   Per test claim: PA required; PA submitted to [facility] via CoverMyMeds Key/confirmation #/EOC [id number] Status is pending

## 2023-07-10 NOTE — Telephone Encounter (Signed)
Pharmacy Patient Advocate Encounter  Received notification from [facility] that Prior Authorization for lubiprostone has been APPROVED from 06/07/2023 to 07/06/2024   Approval letter attached in patients media

## 2023-09-22 ENCOUNTER — Other Ambulatory Visit (HOSPITAL_COMMUNITY): Payer: Self-pay

## 2023-09-22 MED ORDER — WEGOVY 1 MG/0.5ML ~~LOC~~ SOAJ
1.0000 mg | SUBCUTANEOUS | 0 refills | Status: DC
  Filled 2023-09-22: qty 2, 28d supply, fill #0

## 2023-11-06 ENCOUNTER — Ambulatory Visit: Payer: Federal, State, Local not specified - PPO | Admitting: Physician Assistant

## 2023-11-19 ENCOUNTER — Ambulatory Visit: Payer: Federal, State, Local not specified - PPO | Admitting: Internal Medicine

## 2023-11-19 ENCOUNTER — Encounter: Payer: Self-pay | Admitting: Internal Medicine

## 2023-11-19 VITALS — BP 122/80 | HR 77 | Temp 98.6°F | Ht 66.0 in | Wt 180.0 lb

## 2023-11-19 DIAGNOSIS — J189 Pneumonia, unspecified organism: Secondary | ICD-10-CM

## 2023-11-19 MED ORDER — PREDNISONE 20 MG PO TABS: 40.0000 mg | ORAL_TABLET | Freq: Every day | ORAL | 0 refills | Status: DC

## 2023-11-19 MED ORDER — AZITHROMYCIN 250 MG PO TABS: ORAL_TABLET | ORAL | 0 refills | Status: AC

## 2023-11-19 NOTE — Progress Notes (Unsigned)
   Subjective:   Patient ID: Nicole Whitehead, female    DOB: 1981-04-19, 42 y.o.   MRN: [id number]  HPI The patient is a 42 YO female coming in for cough 2 months overall worsening. Onset had covid-19 after a trip. Then a few weeks later cough worsened and yellow sputum. Mild SOB and overall worsening.   Review of Systems  Constitutional: Negative.   HENT: Negative.    Eyes: Negative.   Respiratory:  Positive for cough and shortness of breath. Negative for chest tightness.   Cardiovascular:  Negative for chest pain, palpitations and leg swelling.  Gastrointestinal:  Negative for abdominal distention, abdominal pain, constipation, diarrhea, nausea and vomiting.  Musculoskeletal: Negative.   Skin: Negative.   Neurological: Negative.   Psychiatric/Behavioral: Negative.      Objective:  Physical Exam Constitutional:      Appearance: She is well-developed.  HENT:     Head: Normocephalic and atraumatic.  Cardiovascular:     Rate and Rhythm: Normal rate and regular rhythm.  Pulmonary:     Effort: Pulmonary effort is normal. No respiratory distress.     Breath sounds: Rhonchi present. No wheezing or rales.  Abdominal:     General: Bowel sounds are normal. There is no distension.     Palpations: Abdomen is soft.     Tenderness: There is no abdominal tenderness. There is no rebound.  Musculoskeletal:     Cervical back: Normal range of motion.  Skin:    General: Skin is warm and dry.  Neurological:     Mental Status: She is alert and oriented to person, place, and time.     Coordination: Coordination normal.     Vitals:   11/19/23 1111  BP: 122/80  Pulse: 77  Temp: 98.6 F (37 C)  TempSrc: Oral  SpO2: 94%  Weight: 180 lb (81.6 kg)  Height: 5\' 6"  (1.676 m)    Assessment & Plan:

## 2023-11-19 NOTE — Patient Instructions (Addendum)
We have sent in the z-pack azithromycin which is the antibiotic to take 2 pills on day 1, then 1 pill a day on days 2-5.  We have sent in prednisone which is the anti-inflammatory to start taking if you notice no improvement in 2-3 days. If needed take 2 pills daily for 5 days then stop.

## 2023-11-20 DIAGNOSIS — R053 Chronic cough: Secondary | ICD-10-CM | POA: Insufficient documentation

## 2023-11-20 DIAGNOSIS — J189 Pneumonia, unspecified organism: Secondary | ICD-10-CM | POA: Insufficient documentation

## 2023-11-20 NOTE — Assessment & Plan Note (Addendum)
Suspect post-covid-19 pneumonia with resolution of symptoms with then worsening cough with yellow sputum for 2 months. She is having mild rhonchi on exam with some SOB. Suspect atypical pneumonia so rx azithromycin. If no improvement in 2-3 days needs prednisone course (sent in) and possibly CXR.

## 2023-11-25 ENCOUNTER — Other Ambulatory Visit (HOSPITAL_COMMUNITY): Payer: Self-pay

## 2023-12-02 ENCOUNTER — Ambulatory Visit: Payer: Federal, State, Local not specified - PPO | Admitting: Internal Medicine

## 2023-12-05 ENCOUNTER — Ambulatory Visit (INDEPENDENT_AMBULATORY_CARE_PROVIDER_SITE_OTHER): Payer: Federal, State, Local not specified - PPO

## 2023-12-05 ENCOUNTER — Ambulatory Visit: Payer: Federal, State, Local not specified - PPO | Admitting: Internal Medicine

## 2023-12-05 ENCOUNTER — Encounter: Payer: Self-pay | Admitting: Internal Medicine

## 2023-12-05 VITALS — BP 124/80 | HR 78 | Temp 99.3°F | Ht 66.0 in | Wt 180.0 lb

## 2023-12-05 DIAGNOSIS — R053 Chronic cough: Secondary | ICD-10-CM | POA: Diagnosis not present

## 2023-12-05 MED ORDER — PANTOPRAZOLE SODIUM 40 MG PO TBEC: 40.0000 mg | DELAYED_RELEASE_TABLET | Freq: Every day | ORAL | 3 refills | Status: DC

## 2023-12-05 NOTE — Assessment & Plan Note (Signed)
 Ordered CXR to rule out resistant infection. Suspect GERD as source and starting protonix 40 mg daily. Counseling given about options for cause of cough including asthma, GERD, PND.

## 2023-12-05 NOTE — Progress Notes (Signed)
   Subjective:   Patient ID: Nicole Whitehead, female    DOB: 1981/11/25, 43 y.o.   MRN: [id number]  HPI The patient is a 43 YO female coming in for follow up chronic cough. Completed z-pack and prednisone  without any change in symptoms. Still coughing and some whiteish sputum at times frothy. Some upper abdominal discomfort and reflux of acid.  Review of Systems  Constitutional: Negative.   HENT: Negative.    Eyes: Negative.   Respiratory:  Positive for cough. Negative for chest tightness and shortness of breath.   Cardiovascular:  Negative for chest pain, palpitations and leg swelling.  Gastrointestinal:  Positive for abdominal distention. Negative for abdominal pain, constipation, diarrhea, nausea and vomiting.  Musculoskeletal: Negative.   Skin: Negative.   Neurological: Negative.   Psychiatric/Behavioral: Negative.      Objective:  Physical Exam Constitutional:      Appearance: She is well-developed.  HENT:     Head: Normocephalic and atraumatic.  Cardiovascular:     Rate and Rhythm: Normal rate and regular rhythm.  Pulmonary:     Effort: Pulmonary effort is normal. No respiratory distress.     Breath sounds: Normal breath sounds. No wheezing or rales.  Abdominal:     General: Bowel sounds are normal. There is no distension.     Palpations: Abdomen is soft.     Tenderness: There is no abdominal tenderness. There is no rebound.  Musculoskeletal:     Cervical back: Normal range of motion.  Skin:    General: Skin is warm and dry.  Neurological:     Mental Status: She is alert and oriented to person, place, and time.     Coordination: Coordination normal.     Vitals:   12/05/23 1122  BP: 124/80  Pulse: 78  Temp: 99.3 F (37.4 C)  TempSrc: Oral  SpO2: 94%  Weight: 180 lb (81.6 kg)  Height: 5' 6 (1.676 m)    Assessment & Plan:  Visit time 25 minutes in face to face communication with patient and coordination of care, additional 5 minutes spent in record  review, coordination or care, ordering tests, communicating/referring to other healthcare professionals, documenting in medical records all on the same day of the visit for total time 30 minutes spent on the visit.

## 2023-12-05 NOTE — Patient Instructions (Addendum)
 We will check the chest x-ray today and will start medicine for the acid to help this cough go away.

## 2023-12-12 ENCOUNTER — Ambulatory Visit: Payer: Federal, State, Local not specified - PPO | Admitting: Internal Medicine

## 2023-12-29 ENCOUNTER — Encounter: Payer: Self-pay | Admitting: Internal Medicine

## 2023-12-29 ENCOUNTER — Ambulatory Visit: Payer: Federal, State, Local not specified - PPO | Admitting: Internal Medicine

## 2023-12-29 VITALS — BP 124/80 | HR 71 | Temp 98.5°F | Ht 66.0 in | Wt 185.0 lb

## 2023-12-29 DIAGNOSIS — K219 Gastro-esophageal reflux disease without esophagitis: Secondary | ICD-10-CM

## 2023-12-29 DIAGNOSIS — R053 Chronic cough: Secondary | ICD-10-CM

## 2023-12-29 DIAGNOSIS — F322 Major depressive disorder, single episode, severe without psychotic features: Secondary | ICD-10-CM

## 2023-12-29 MED ORDER — BUPROPION HCL ER (XL) 150 MG PO TB24: 150.0000 mg | ORAL_TABLET | Freq: Every day | ORAL | 6 refills | Status: DC

## 2023-12-29 MED ORDER — ESOMEPRAZOLE MAGNESIUM 40 MG PO CPDR: 40.0000 mg | DELAYED_RELEASE_CAPSULE | Freq: Every day | ORAL | 3 refills | Status: AC

## 2023-12-29 NOTE — Patient Instructions (Addendum)
We have filled out the [facility] papers for you to be off 4 weeks.  We will stop protonix and start nexium (esomeprazole) to take 1 pill daily before breakfast. Let us know in 1 week or so if helping.  We will get you in with pulmonary to check the lungs.  We will start wellbutrin (bupropion) to take 1 pill daily and follow up 3-4 weeks. Let us know sooner if any side effects.

## 2023-12-29 NOTE — Progress Notes (Signed)
   Subjective:   Patient ID: Nicole Whitehead, female    DOB: 1981-07-29, 43 y.o.   MRN: [id number]  HPI The patient is a 43 YO female coming in for follow up chronic cough. She started protonix about 3 weeks ago and has not noticed significant difference and her heartburn symptoms are still noticeable and not improved as well. She is also having severe stress at her job and feels unable to concentrate due to this. She is seeing counselor through work. They recommended her to consider starting medication and may need some time off work to recover. She denies SI/HI but very severe symptoms.  Review of Systems  Constitutional: Negative.   HENT: Negative.    Eyes: Negative.   Respiratory:  Positive for cough. Negative for chest tightness and shortness of breath.   Cardiovascular:  Negative for chest pain, palpitations and leg swelling.  Gastrointestinal:  Negative for abdominal distention, abdominal pain, constipation, diarrhea, nausea and vomiting.       Heartburn  Musculoskeletal: Negative.   Skin: Negative.   Neurological: Negative.   Psychiatric/Behavioral: Negative.      Objective:  Physical Exam Constitutional:      Appearance: She is well-developed.  HENT:     Head: Normocephalic and atraumatic.  Cardiovascular:     Rate and Rhythm: Normal rate and regular rhythm.  Pulmonary:     Effort: Pulmonary effort is normal. No respiratory distress.     Breath sounds: Normal breath sounds. No wheezing or rales.  Abdominal:     General: Bowel sounds are normal. There is no distension.     Palpations: Abdomen is soft.     Tenderness: There is no abdominal tenderness. There is no rebound.  Musculoskeletal:     Cervical back: Normal range of motion.  Skin:    General: Skin is warm and dry.  Neurological:     Mental Status: She is alert and oriented to person, place, and time.     Coordination: Coordination normal.     Vitals:   12/29/23 1340  BP: 124/80  Pulse: 71  Temp:  98.5 F (36.9 C)  TempSrc: Oral  SpO2: 97%  Weight: 185 lb (83.9 kg)  Height: 5\' 6"  (1.676 m)    Assessment & Plan:

## 2024-01-02 DIAGNOSIS — K219 Gastro-esophageal reflux disease without esophagitis: Secondary | ICD-10-CM | POA: Insufficient documentation

## 2024-01-02 DIAGNOSIS — F322 Major depressive disorder, single episode, severe without psychotic features: Secondary | ICD-10-CM | POA: Insufficient documentation

## 2024-01-02 NOTE — Assessment & Plan Note (Signed)
New severe MDD and starting bupropion 150 mg daily. Follow up 3-4 weeks for dose adjustment. FMLA filled out during visit for 4 weeks off so she can adjust to medication and continue counseling as well.

## 2024-01-02 NOTE — Assessment & Plan Note (Signed)
Protonix 40 mg daily for 3 weeks not effective. Changing to nexium 40 mg daily and if not effective may need referral to GI. I suspect this is a strong component of her chronic cough.

## 2024-01-02 NOTE — Assessment & Plan Note (Signed)
Changing protonix 40 mg daily to nexium 40 mg daily to see if this works better. Suspect GERD is cause of chronic cough but she is still unsure. Will refer to pulmonary to help exclude cough variant asthma. She is not contagious and reassurance given there.

## 2024-01-13 ENCOUNTER — Telehealth: Payer: Self-pay | Admitting: Internal Medicine

## 2024-01-13 NOTE — Telephone Encounter (Unsigned)
Copied from CRM [id number]. Topic: General - Other >> Jan 13, 2024  2:17 PM Nicole Whitehead wrote: Reason for CRM: Patient called in after speaking with pulmonary office. The only 2 available appointments were 02/27  at [facility] location and 03/4 at [facility] location. She would like provider to advise which appt she should take. She has an appointment with provider on 02/24 for a 4-week follow up concerning pulmonary issues and doesn't know if provider was wanting her evaluated prior to their appointment. The pulmonary office did advise if the provider's office contacted them, they would be more able to fit her in prior to her appointment on 02/24 with provider. They have held both mentioned appointments for patient but will need to know by tomorrow which one she would like to take. Request CB# [phone]  Was unable to edit previous EAV([id number]) sent and add above mentioned information.

## 2024-01-14 NOTE — Telephone Encounter (Signed)
Whichever apt she is able to attend and what location she wants. This is a chronic issue so a few days difference will not make a difference.

## 2024-01-26 ENCOUNTER — Encounter: Payer: Self-pay | Admitting: Internal Medicine

## 2024-01-26 ENCOUNTER — Ambulatory Visit: Payer: Federal, State, Local not specified - PPO | Admitting: Internal Medicine

## 2024-01-26 ENCOUNTER — Telehealth: Payer: Self-pay | Admitting: Internal Medicine

## 2024-01-26 VITALS — BP 128/64 | HR 70 | Temp 98.2°F | Ht 66.0 in | Wt 188.0 lb

## 2024-01-26 DIAGNOSIS — J3089 Other allergic rhinitis: Secondary | ICD-10-CM

## 2024-01-26 DIAGNOSIS — F322 Major depressive disorder, single episode, severe without psychotic features: Secondary | ICD-10-CM

## 2024-01-26 DIAGNOSIS — R053 Chronic cough: Secondary | ICD-10-CM

## 2024-01-26 DIAGNOSIS — K219 Gastro-esophageal reflux disease without esophagitis: Secondary | ICD-10-CM | POA: Diagnosis not present

## 2024-01-26 MED ORDER — FAMOTIDINE 40 MG PO TABS
40.0000 mg | ORAL_TABLET | Freq: Every day | ORAL | 3 refills | Status: AC
Start: 2024-01-26 — End: ?

## 2024-01-26 MED ORDER — SERTRALINE HCL 50 MG PO TABS: 50.0000 mg | ORAL_TABLET | Freq: Every day | ORAL | 3 refills | Status: DC

## 2024-01-26 NOTE — Progress Notes (Unsigned)
   Subjective:   Patient ID: Nicole Whitehead, female    DOB: May 10, 1981, 43 y.o.   MRN: [id number]  HPI The patient is a 43 YO female coming in for follow up MDD and GERD and chronic cough.She is taking wellbutrin but does not feel this is helping. She has headaches daily and they are not improving. Cough is not improving about the same. Still having GERD symptoms despite change in PPI. She is still having stress from work although she is on FMLA due to people emailing her.   Review of Systems  Constitutional: Negative.   HENT: Negative.    Eyes: Negative.   Respiratory:  Positive for cough. Negative for chest tightness and shortness of breath.   Cardiovascular:  Negative for chest pain, palpitations and leg swelling.  Gastrointestinal:  Negative for abdominal distention, abdominal pain, constipation, diarrhea, nausea and vomiting.  Musculoskeletal: Negative.   Skin: Negative.   Neurological: Negative.   Psychiatric/Behavioral:  Positive for decreased concentration. The patient is nervous/anxious.     Objective:  Physical Exam Constitutional:      Appearance: She is well-developed.  HENT:     Head: Normocephalic and atraumatic.  Cardiovascular:     Rate and Rhythm: Normal rate and regular rhythm.  Pulmonary:     Effort: Pulmonary effort is normal. No respiratory distress.     Breath sounds: Normal breath sounds. No wheezing or rales.  Abdominal:     General: Bowel sounds are normal. There is no distension.     Palpations: Abdomen is soft.     Tenderness: There is no abdominal tenderness. There is no rebound.  Musculoskeletal:     Cervical back: Normal range of motion.  Skin:    General: Skin is warm and dry.  Neurological:     Mental Status: She is alert and oriented to person, place, and time.     Coordination: Coordination normal.     Vitals:   01/26/24 0933  BP: 128/64  Pulse: 70  Temp: 98.2 F (36.8 C)  TempSrc: Oral  SpO2: 98%  Weight: 188 lb (85.3 kg)   Height: 5\' 6"  (1.676 m)    Assessment & Plan:

## 2024-01-26 NOTE — Telephone Encounter (Signed)
 Patient dropped off document FMLA, to be filled out by provider. Patient requested to send it back via Call Patient to pick up within 2-days. Document is located in providers tray at front office.Please advise at Mobile [phone] (mobile)   Patient requested paperwork be completed by 01/28/2024. She was informed of the standard turnaround time of 7/10 business days, but she would like for it to be completed ASAP because her current FMLA is expiring 01/31/2024.

## 2024-01-26 NOTE — Patient Instructions (Addendum)
 We have sent in sertraline to take in the evening to help with the mood and may help with sleep.   We will add pepcid to take in the evening for the acid. Keep taking nexium (esomeprazole) in the morning).

## 2024-01-27 NOTE — Assessment & Plan Note (Addendum)
 Still with severe symptoms and not improved at all on wellbutrin and she is getting side effects. She will stop this and start zoloft 50 mg daily as well. She is having problems sleeping as well. She is tired and not motivated during the day. No SI/HI. She is in counseling. Needs continued FMLA due to starting new medication. Close follow up needed in 4 weeks.

## 2024-01-27 NOTE — Assessment & Plan Note (Signed)
 Not improved despite switching PPI. She is taking nexium 40 mg daily and will add pepcid 40 mg at bedtime. She may need referral to GI if no improvement. She wishes to see pulmonary and see if they agree GERD is playing into the chronic cough symptoms.

## 2024-01-27 NOTE — Assessment & Plan Note (Signed)
 Not improved and still having PND and GERD symptoms. She is taking nexium 40 mg daily and will add pepcid 40 mg at bedtime. She has upcoming visit with pulmonary and we spent extensive time discussing how URI can trigger chronic cough which is intractable. She is advised to stop peppermint and menthol containing lozenges and avoid clearing throat and coughing. She is not contagious and no repeat CXR needed today.

## 2024-01-27 NOTE — Telephone Encounter (Signed)
Placed inside office box 

## 2024-01-27 NOTE — Assessment & Plan Note (Signed)
 Still with symptoms and taking otc allergy as needed.

## 2024-02-03 ENCOUNTER — Institutional Professional Consult (permissible substitution): Payer: Federal, State, Local not specified - PPO | Admitting: Internal Medicine

## 2024-02-19 ENCOUNTER — Ambulatory Visit: Admitting: Pulmonary Disease

## 2024-02-19 ENCOUNTER — Encounter: Payer: Self-pay | Admitting: Pulmonary Disease

## 2024-02-19 ENCOUNTER — Ambulatory Visit

## 2024-02-19 VITALS — BP 126/82 | HR 94 | Temp 98.5°F | Ht 68.0 in | Wt 191.8 lb

## 2024-02-19 DIAGNOSIS — R053 Chronic cough: Secondary | ICD-10-CM

## 2024-02-19 MED ORDER — ALBUTEROL SULFATE HFA 108 (90 BASE) MCG/ACT IN AERS
2.0000 | INHALATION_SPRAY | Freq: Four times a day (QID) | RESPIRATORY_TRACT | 6 refills | Status: AC | PRN
Start: 2024-02-19 — End: ?

## 2024-02-19 NOTE — Progress Notes (Signed)
 Nicole Whitehead    [id number]    May 29, 1981  Primary Care Physician:Crawford, Austin Miles, MD  Referring Physician: Myrlene Broker, MD [address] [place],  [place] [address]  Chief complaint:   Patient being seen for a chronic cough  HPI:  Patient has had a cough since about November  Tested-self testing positive for COVID, tested in a doctor's office was negative about January Frequency and severity of the coughing has come and gone  About a month ago did cough up blood for couple of days and then now coughing up just clear mucus Has not felt acutely ill No fevers, no chills  Did have COVID in 2022, left her with a post-COVID cough for well and then this resolved  History of asthma when she was younger Has not been on inhalers for many years  Has a pet Bishon,  that she has had for 14 years  She does feel chest tightness, shortness of breath sometimes at rest She is still able to exercise  Cough gets worse as the day progresses Voice changes does get worse as the day progresses She is able to sleep at night It seems like symptoms have just been recurrent for the last few months  She never smoked  She works from home for the [employer]  She does have some dysphagia/dyspepsia Feels food is not going down well Heartburn indigestion for which she is on a PPI  Outpatient Encounter Medications as of 02/19/2024  Medication Sig   AMBULATORY NON FORMULARY MEDICATION Medication Name: Nitroglycerin ointment 0.125% use pea sized amount 3-4 times a day per rectum x 8 weeks   esomeprazole (NEXIUM) 40 MG capsule Take 1 capsule (40 mg total) by mouth daily.   famotidine (PEPCID) 40 MG tablet Take 1 tablet (40 mg total) by mouth at bedtime.   sertraline (ZOLOFT) 50 MG tablet Take 1 tablet (50 mg total) by mouth daily.   [DISCONTINUED] lubiprostone (AMITIZA) 24 MCG capsule Take 1 capsule (24 mcg total) by mouth 2 (two) times daily with a meal. (Patient not  taking: Reported on 02/19/2024)   No facility-administered encounter medications on file as of 02/19/2024.    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 [facility]    PAP SMEAR, ABNORMAL 2008   SCOLIOSIS, MILD     Past Surgical History:  Procedure Laterality Date   COLONOSCOPY WITH PROPOFOL N/A 04/04/2022   Procedure: COLONOSCOPY WITH PROPOFOL;  Surgeon: Toney Reil, MD;  Location: [facility] ENDOSCOPY;  Service: Gastroenterology;  Laterality: N/A;   COLPOSCOPY  03/10/2006   FOOT SURGERY     KNEE SURGERY      Family History  Problem Relation Age of Onset   Breast cancer Paternal Aunt 8    Social History   Socioeconomic History   Marital status: Single    Spouse name: Not on file   Number of children: 0   Years of education: 16   Highest education level: Bachelor's degree (e.g., BA, AB, BS)  Occupational History   Occupation: Salesperson    Comment: Estate agent  Tobacco Use   Smoking status: Never   Smokeless tobacco: Never   Tobacco comments:    Single, lives alone with her dog. worked at [employer] [place] until 12/2012  Substance and Sexual Activity   Alcohol use: No   Drug use: No   Sexual activity: Not Currently    Birth control/protection: Condom  Other Topics Concern   Not on file  Social History Narrative   Not on file   Social Drivers of Health   Financial Resource Strain: Low Risk  (12/04/2023)   Overall Financial Resource Strain (CARDIA)    Difficulty of Paying Living Expenses: Not very hard  Food Insecurity:  Food Insecurity Present (12/04/2023)   Hunger Vital Sign    Worried About Running Out of Food in the Last Year: Sometimes true    Ran Out of Food in the Last Year: Sometimes true  Transportation Needs: No Transportation Needs (12/04/2023)   PRAPARE - Administrator, Civil Service (Medical): No    Lack of Transportation (Non-Medical): No  Physical Activity: Sufficiently Active (12/04/2023)   Exercise Vital Sign    Days of Exercise per Week: 7 days    Minutes of Exercise per Session: 90 min  Stress: No Stress Concern Present (12/04/2023)   [employer] of Occupational Health - Occupational Stress Questionnaire    Feeling of Stress : Not at all  Social Connections: Moderately Integrated (12/04/2023)   Social Connection and Isolation Panel [NHANES]    Frequency of Communication with Friends and Family: More than three times a week    Frequency of Social Gatherings with Friends and Family: More than three times a week    Attends Religious Services: More than 4 times per year    Active Member of [employer] or Organizations: Yes    Attends Banker Meetings: More than 4 times per year    Marital Status: Never married  Intimate Partner Violence: Unknown (03/07/2022)   Received from [employer], [facility]   HITS    Physically Hurt: Not on file    Insult or Talk Down To: Not on file    Threaten Physical Harm: Not on file    Scream or Curse: Not on file    Review of Systems  Respiratory:  Positive for cough and shortness of breath.     Vitals:   02/19/24 1431  BP: 126/82  Pulse: 94  Temp: 98.5 F (36.9 C)  SpO2: 100%     Physical Exam Constitutional:      Appearance: Normal appearance.  HENT:     Head: Normocephalic.     Nose: Nose normal.     Mouth/Throat:     Mouth: Mucous membranes are moist.  Eyes:     General: No scleral icterus. Cardiovascular:     Rate and Rhythm: Normal rate and regular rhythm.     Heart sounds: No murmur heard.    No friction  rub.  Pulmonary:     Effort: No respiratory distress.     Breath sounds: No stridor. No wheezing or rhonchi.  Musculoskeletal:     Cervical back: No rigidity or tenderness.  Neurological:     Mental Status: She is alert.  Psychiatric:        Mood and Affect: Mood normal.     Data Reviewed: Chest x-ray in January 2025-no acute  Assessment:  Cough, shortness of breath Chronic cough may be  related to having a COVID infection that made her airway hyperactive  She does have a past history of asthma Never smoker No pertinent occupational history  History of reflux for which she is on a PPI  Past history of COVID infection that left her with airway hyperresponsiveness  Plan/Recommendations: Obtain chest x-ray  Schedule for pulmonary function test  Prescription for albuterol to be used as needed  Encouraged to give [place] a call if any worsening of symptoms or signs suggesting an infectious process where she may benefit from antibiotics and steroids  CT scan can be considered if symptoms do not continue to improve  Encouraged to stay active  Follow-up in about 6 to 8 weeks  Consider GI evaluation if persistence of dysphagia or sense of fullness, food not going down after meals   Virl Diamond MD [facility] 02/19/2024, 2:39 PM  CC: Myrlene Broker, *

## 2024-02-19 NOTE — Patient Instructions (Signed)
 Symptoms may be related to recent infection that is taking time to settle down, may have made your airway more hyperactive with a history of asthma in the past  I will call in an inhaler for you to be used as needed, can be used up to 4 times a day to keep your airway open  Call us if any exacerbation of symptoms, worsening cough or congestion -We will call in an antibiotic if needed  Schedule you for breathing study  Schedule for chest x-ray today  If persistent difficulty with food feeling like it is getting stuck, sense of heaviness after you eat-May need to follow-up with a GI doctor for an endoscopy  Mucinex-may help with secretions Mucinex DM-the dextromethorphan may help with calming a cough to help you sleep  Follow-up in 6 to 8 weeks  Call with significant concerns  Try and maintain level of activity

## 2024-02-23 ENCOUNTER — Encounter: Payer: Self-pay | Admitting: Internal Medicine

## 2024-02-23 ENCOUNTER — Ambulatory Visit: Payer: Federal, State, Local not specified - PPO | Admitting: Internal Medicine

## 2024-02-23 VITALS — BP 122/80 | HR 69 | Temp 97.7°F | Ht 68.0 in | Wt 191.0 lb

## 2024-02-23 DIAGNOSIS — R053 Chronic cough: Secondary | ICD-10-CM

## 2024-02-23 DIAGNOSIS — F322 Major depressive disorder, single episode, severe without psychotic features: Secondary | ICD-10-CM | POA: Diagnosis not present

## 2024-02-23 MED ORDER — SERTRALINE HCL 100 MG PO TABS: 100.0000 mg | ORAL_TABLET | Freq: Every day | ORAL | 3 refills | Status: AC

## 2024-02-23 NOTE — Assessment & Plan Note (Signed)
 With moderate to severe symptoms still. Sleeping is worse due to pollen, allergies and coughing. She will increase zoloft to 100 mg daily and let us know in 3-4 weeks how she is doing. We will plan to release back to work on April 1st 2025 and work note done for her today.

## 2024-02-23 NOTE — Progress Notes (Signed)
   Subjective:   Patient ID: Nicole Whitehead, female    DOB: 02/06/1981, 43 y.o.   MRN: [id number]  HPI The patient is a 43 YO female coming in for follow up starting sertraline 50 mg daily. Was previously having problems with sleeping, tiredness throughout day and lack of motivation. She is also struggling with chronic cough. Has seen pulmonary and they are getting PFTs on her and did CXR which was normal.      02/23/2024    9:00 AM 12/29/2023    3:26 PM 11/19/2023   11:14 AM 10/10/2022    8:12 AM 02/06/2022    8:56 AM  Depression screen PHQ 2/9  Decreased Interest 3 3 0 0 0  Down, Depressed, Hopeless 3 3 0 0 0  PHQ - 2 Score 6 6 0 0 0  Altered sleeping 3 3 0 0   Tired, decreased energy 3 3 0 1   Change in appetite 3 3 0 0   Feeling bad or failure about yourself  1 2 0 0   Trouble concentrating 3 3 0 0   Moving slowly or fidgety/restless 0 3 0 1   Suicidal thoughts 0 0 0 0   PHQ-9 Score 19 23 0 2   Difficult doing work/chores Very difficult Extremely dIfficult Not difficult at all     Review of Systems  Constitutional: Negative.   HENT: Negative.    Eyes: Negative.   Respiratory:  Positive for cough. Negative for chest tightness and shortness of breath.   Cardiovascular:  Negative for chest pain, palpitations and leg swelling.  Gastrointestinal:  Negative for abdominal distention, abdominal pain, constipation, diarrhea, nausea and vomiting.  Musculoskeletal: Negative.   Skin: Negative.   Neurological: Negative.   Psychiatric/Behavioral:  Positive for decreased concentration, dysphoric mood and sleep disturbance. The patient is nervous/anxious.     Objective:  Physical Exam Constitutional:      Appearance: She is well-developed.  HENT:     Head: Normocephalic and atraumatic.  Cardiovascular:     Rate and Rhythm: Normal rate and regular rhythm.  Pulmonary:     Effort: Pulmonary effort is normal. No respiratory distress.     Breath sounds: Normal breath sounds. No  wheezing or rales.     Comments: Coughing and clearing throat during visit Abdominal:     General: Bowel sounds are normal. There is no distension.     Palpations: Abdomen is soft.     Tenderness: There is no abdominal tenderness. There is no rebound.  Musculoskeletal:     Cervical back: Normal range of motion.  Skin:    General: Skin is warm and dry.  Neurological:     Mental Status: She is alert and oriented to person, place, and time.     Coordination: Coordination normal.    Vitals:   02/23/24 0855  BP: 122/80  Pulse: 69  Temp: 97.7 F (36.5 C)  TempSrc: Temporal  SpO2: 100%  Weight: 191 lb (86.6 kg)  Height: 5\' 8"  (1.727 m)    Assessment & Plan:

## 2024-02-23 NOTE — Patient Instructions (Signed)
 We will increase the sertraline to 100 mg daily (okay to take 2pill daily of what you have left and then refill 100 mg to take 1pill daily).

## 2024-02-23 NOTE — Assessment & Plan Note (Signed)
 She is coughing and clearing throat throughout the visit and pulmonary is continuing to get further workup of this.

## 2024-02-26 ENCOUNTER — Institutional Professional Consult (permissible substitution): Payer: Federal, State, Local not specified - PPO | Admitting: Pulmonary Disease

## 2024-03-15 ENCOUNTER — Ambulatory Visit: Admitting: Internal Medicine

## 2024-03-15 ENCOUNTER — Encounter: Payer: Self-pay | Admitting: Internal Medicine

## 2024-03-15 VITALS — BP 114/80 | HR 70 | Temp 98.5°F | Ht 68.0 in | Wt 193.0 lb

## 2024-03-15 DIAGNOSIS — F322 Major depressive disorder, single episode, severe without psychotic features: Secondary | ICD-10-CM

## 2024-03-15 DIAGNOSIS — R053 Chronic cough: Secondary | ICD-10-CM

## 2024-03-15 NOTE — Assessment & Plan Note (Signed)
 She has seen some stabilization of her mood since increase. We will keep zoloft 100 mg daily. Continue to monitor and if needed can do accommodation forms for her.

## 2024-03-15 NOTE — Assessment & Plan Note (Signed)
 We discussed again about the etiology and the vicious cycle of this problem. She is scheduled for PFTs in a month or two.

## 2024-03-15 NOTE — Progress Notes (Signed)
   Subjective:   Patient ID: Nicole Whitehead, female    DOB: May 19, 1981, 43 y.o.   MRN: [id number]  HPI The patient is a 43 YO female coming in for follow up MDD. We did increase zoloft to 100 mg daily at last visit. She is about stable from last visit. She is back to work which is stressful. She is trying this for the next month or so.   Review of Systems  Constitutional: Negative.   HENT: Negative.    Eyes: Negative.   Respiratory:  Positive for cough. Negative for chest tightness and shortness of breath.   Cardiovascular:  Negative for chest pain, palpitations and leg swelling.  Gastrointestinal:  Negative for abdominal distention, abdominal pain, constipation, diarrhea, nausea and vomiting.  Musculoskeletal: Negative.   Skin: Negative.   Neurological: Negative.   Psychiatric/Behavioral:  Positive for decreased concentration and dysphoric mood.     Objective:  Physical Exam Constitutional:      Appearance: She is well-developed.  HENT:     Head: Normocephalic and atraumatic.  Cardiovascular:     Rate and Rhythm: Normal rate and regular rhythm.  Pulmonary:     Effort: Pulmonary effort is normal. No respiratory distress.     Breath sounds: Normal breath sounds. No wheezing or rales.     Comments: Throat clearing during visit and some cough Abdominal:     General: Bowel sounds are normal. There is no distension.     Palpations: Abdomen is soft.     Tenderness: There is no abdominal tenderness. There is no rebound.  Musculoskeletal:     Cervical back: Normal range of motion.  Skin:    General: Skin is warm and dry.  Neurological:     Mental Status: She is alert and oriented to person, place, and time.     Coordination: Coordination normal.     Vitals:   03/15/24 0831  BP: 114/80  Pulse: 70  Temp: 98.5 F (36.9 C)  TempSrc: Oral  SpO2: 98%  Weight: 193 lb (87.5 kg)  Height: 5\' 8"  (1.727 m)    Assessment & Plan:

## 2024-04-05 ENCOUNTER — Ambulatory Visit: Admitting: Pulmonary Disease

## 2024-06-10 ENCOUNTER — Ambulatory Visit: Admitting: Pulmonary Disease

## 2024-06-10 ENCOUNTER — Encounter

## 2024-06-29 ENCOUNTER — Encounter (HOSPITAL_COMMUNITY): Payer: Self-pay

## 2024-06-29 ENCOUNTER — Emergency Department (HOSPITAL_COMMUNITY)
Admission: EM | Admit: 2024-06-29 | Discharge: 2024-06-30 | Disposition: A | Discharge status: Home / Self Care | Source: Ambulatory Visit | Attending: Emergency Medicine | Admitting: Emergency Medicine

## 2024-06-29 ENCOUNTER — Emergency Department (HOSPITAL_COMMUNITY)

## 2024-06-29 ENCOUNTER — Ambulatory Visit: Payer: Self-pay

## 2024-06-29 ENCOUNTER — Other Ambulatory Visit: Payer: Self-pay

## 2024-06-29 DIAGNOSIS — K625 Hemorrhage of anus and rectum: Secondary | ICD-10-CM | POA: Diagnosis not present

## 2024-06-29 DIAGNOSIS — D259 Leiomyoma of uterus, unspecified: Secondary | ICD-10-CM | POA: Diagnosis not present

## 2024-06-29 DIAGNOSIS — R109 Unspecified abdominal pain: Secondary | ICD-10-CM | POA: Diagnosis present

## 2024-06-29 LAB — COMPREHENSIVE METABOLIC PANEL WITH GFR
ALT: 20 U/L (ref 0–44)
AST: 26 U/L (ref 15–41)
Albumin: 3.5 g/dL (ref 3.5–5.0)
Alkaline Phosphatase: 43 U/L (ref 38–126)
Anion gap: 10 (ref 5–15)
BUN: 9 mg/dL (ref 6–20)
CO2: 22 mmol/L (ref 22–32)
Calcium: 8.7 mg/dL — ABNORMAL LOW (ref 8.9–10.3)
Chloride: 106 mmol/L (ref 98–111)
Creatinine, Ser: 0.9 mg/dL (ref 0.44–1.00)
GFR, Estimated: >60 mL/min (ref >60)
Glucose, Bld: 74 mg/dL (ref 70–99)
Potassium: 3.8 mmol/L (ref 3.5–5.1)
Sodium: 138 mmol/L (ref 135–145)
Total Bilirubin: 0.6 mg/dL (ref 0.0–1.2)
Total Protein: 6.6 g/dL (ref 6.5–8.1)

## 2024-06-29 LAB — CBC
HCT: 39 % (ref 36.0–46.0)
Hemoglobin: 14.3 g/dL (ref 12.0–15.0)
MCH: 29.2 pg (ref 26.0–34.0)
MCHC: 36.7 g/dL — ABNORMAL HIGH (ref 30.0–36.0)
MCV: 79.6 fL — ABNORMAL LOW (ref 80.0–100.0)
Platelets: 255 K/uL (ref 150–400)
RBC: 4.9 MIL/uL (ref 3.87–5.11)
RDW: 12.8 % (ref 11.5–15.5)
WBC: 6.5 K/uL (ref 4.0–10.5)
nRBC: 0 % (ref 0.0–0.2)

## 2024-06-29 LAB — HCG, SERUM, QUALITATIVE: Preg, Serum: NEGATIVE

## 2024-06-29 LAB — TROPONIN I (HIGH SENSITIVITY): Troponin I (High Sensitivity): 3 ng/L (ref <18)

## 2024-06-29 NOTE — ED Triage Notes (Signed)
 Pt c/o CP and bright red bloody stool since Sunday; also endorsing migraine, dizziness, and L sided abd pain; evaluated at urgent care last night; states recent colonoscopy showed anal fissures and hemorrhoids

## 2024-06-29 NOTE — ED Notes (Signed)
 I was not able to get 2nd trop.

## 2024-06-29 NOTE — Telephone Encounter (Signed)
 PRIMARY CARE FYI Only or Action Required?: Action required by provider: update on patient condition and request for documentation or forms. - requesting FMLA like last time, understands she will follow up with PCP after hospital  Patient was last seen in primary care on 03/15/2024 by Rollene Almarie LABOR, MD.  Called Nurse Triage reporting Chest Pain, Blood In Stools, Shortness of Breath, Nausea, and Dizziness.  Symptoms began several days ago.  Interventions attempted: Other: UC visit yesterday.  Symptoms are: persisting and severe.  Triage Disposition: Call EMS 911 Now  Patient/caregiver understands and will follow disposition?: No, refuses disposition  PULMONARY  FYI Only or Action Required?: FYI only for provider.  Patient is followed in Pulmonology for chronic cough, last seen on 02/19/2024 by Neda Jennet LABOR, MD.   Copied from CRM [id number]. Topic: Clinical - Red Word Triage >> Jun 29, 2024  9:40 AM Harlene ORN wrote: Red Word that prompted transfer to Nurse Triage: wants to see if she can see dr. rollene today experiencing tightness in her chest and blood in her stool since Sunday went to ER last night Reason for Disposition  [1] Chest pain lasts > 5 minutes AND [2] described as crushing, pressure-like, or heavy  Answer Assessment - Initial Assessment Questions 1. LOCATION: Where does it hurt?       Tightness in chest at breastbone, feels very heavy 2. RADIATION: Does the pain go anywhere else? (e.g., into neck, jaw, arms, back)     Back between shoulder and back 3. ONSET: When did the chest pain begin? (Minutes, hours or days)      Sunday 4. PATTERN: Does the pain come and go, or has it been constant since it started?  Does it get worse with exertion?      Comes and goes, not consistent, about the same as at [facility] yesterday 5. DURATION: How long does it last (e.g., seconds, minutes, hours)     Longer than 5 min 6. SEVERITY: How bad is the pain?  (e.g.,  Scale 1-10; mild, moderate, or severe)     Up to 7/10 then feel like can't breathe 7. CARDIAC RISK FACTORS: Do you have any history of heart problems or risk factors for heart disease? (e.g., angina, prior heart attack; diabetes, high blood pressure, high cholesterol, smoker, or strong family history of heart disease)     denies 8. PULMONARY RISK FACTORS: Do you have any history of lung disease?  (e.g., blood clots in lung, asthma, emphysema, birth control pills)     denies 10. OTHER SYMPTOMS: Do you have any other symptoms? (e.g., dizziness, nausea, vomiting, sweating, fever, difficulty breathing, cough)       Felt very nauseous yesterday and little dizziness because had a headache, not really normal for me, I don't get headaches, I sweat all the time and hot all the time hands and feet her normal 11. PREGNANCY: Is there any chance you are pregnant? When was your last menstrual period?       No   Advised pt go to hospital right away, call 911, pt confirms she has someone with her. Pt prefers to have family drive her to hospital, advised call 911 if any worsening or new symptoms like passing out or too weak to stand.  Went to UC yesterday, EKG was fine, but said they didn't have all the equipment Advised strongly that pt go to hospital for full work-up asap  When was stressed, think same thing happening now, just more intense, when was  stressed out, bleeding that's happening, same symptoms, with GI bleeding, chest pain, SOB, nausea, etc., saw PCP and pulm need FMLA paperwork figured out, went back to work earlier last time, wondering if need to get that note to get to HR at job, UC gave work note for today    Advised pt go to hospital right away, call 911, pt confirms she has someone with her. Pt prefers to have family drive her to hospital, advised call 911 if any worsening or new symptoms like passing out or too weak to stand. Advised that hospital can give work note for now, follow  up with PCP and pulm after, sending message to both PCP and pulm for follow up.  Protocols used: Chest Pain-A-AH

## 2024-06-29 NOTE — ED Provider Triage Note (Signed)
 Emergency Medicine Provider Triage Evaluation Note  Nicole Whitehead , a 43 y.o. female  was evaluated in triage.  Pt complains of chest pain, bright red blood in stool since Sunday. Also feels like she has a migraine, dizziness, and L sided abdominal pain. Evaluated at UC last night. Recent colonoscopy showed anal fissures and hemorrhoids. Hemorrhoids also present on exam yesterday at [facility]. Patient states that she is light headed when sitting down and then this changes to dizziness and the room spinning when standing. States she never gets headaches.   Review of Systems  Positive: Hematochezia, chest pain, abdominal pain, headache vs. Migraine, dizziness Negative: Fever, chills, shortness of breath  Physical Exam  BP 128/83   Pulse 91   Temp 98.2 F (36.8 C) (Oral)   Resp 16   Ht 5' 7 (1.702 m)   Wt 79.4 kg   LMP 06/08/2024   SpO2 99%   BMI 27.41 kg/m  Gen:   Awake, no distress   Resp:  Normal effort  MSK:   Moves extremities without difficulty  Other:  Grossly neurologically intact, vital signs stable, patient alert and oriented and engaged in conversation, vision grossly intact, patient not in acute distress, left-sided abdominal tenderness with palpation  Medical Decision Making  Medically screening exam initiated at 8:21 PM.  Appropriate orders placed.  Nicole Whitehead was informed that the remainder of the evaluation will be completed by another provider, this initial triage assessment does not replace that evaluation, and the importance of remaining in the ED until their evaluation is complete.  Orders: CBC, CMP, serum pregnancy, UA, troponin, chest x-ray, CT head without contrast   Janetta Terrall FALCON, PA-C 06/29/24 2031

## 2024-06-29 NOTE — Telephone Encounter (Signed)
 FMLA forms can be processed once she follows up with primary care

## 2024-06-30 ENCOUNTER — Emergency Department (HOSPITAL_COMMUNITY)

## 2024-06-30 ENCOUNTER — Telehealth: Payer: Self-pay

## 2024-06-30 LAB — TROPONIN I (HIGH SENSITIVITY): Troponin I (High Sensitivity): 3 ng/L (ref <18)

## 2024-06-30 MED ORDER — HYDROCORTISONE ACETATE 25 MG RE SUPP: 25.0000 mg | Freq: Two times a day (BID) | RECTAL | 0 refills | Status: DC

## 2024-06-30 MED ORDER — HYDROCORTISONE (PERIANAL) 2.5 % EX CREA
1.0000 | TOPICAL_CREAM | Freq: Two times a day (BID) | CUTANEOUS | 0 refills | Status: DC
Start: 2024-06-30 — End: 2024-07-13

## 2024-06-30 MED ORDER — IOHEXOL 350 MG/ML SOLN
75.0000 mL | Freq: Once | INTRAVENOUS | Status: AC | PRN
  Administered 2024-06-30: 75 mL via INTRAVENOUS

## 2024-06-30 NOTE — Discharge Instructions (Addendum)
 CT scan of the head is normal.  Chest x-ray and lab work today is normal.  CT scan shows a large fibroid. I am sending you home with medicine including steroid which should help with hemorrhoid and anal fissure.  You can continue using the topical nitroglycerin on this area as well.  Please follow-up with your GI doctor.  I also recommend following up with Dr. Henry for fibroid if you need OB/GYN.  For pain you can alternate Tylenol  and ibuprofen .  Return to emergency room with new or worsening symptoms.

## 2024-06-30 NOTE — Telephone Encounter (Signed)
 Copied from CRM [id number]. Topic: General - Other >> Jun 30, 2024 12:07 PM Rosina BIRCH wrote: Reason for CRM: patient called stating she was triaged on yesterday regarding having abdominal pain on the left side, chest pain, rectal bleeding, migraine and headache. Patient went to the urgent care and they did a EKG on her and told her to go the emergency room because she needed additional testing done. Patient stated that [facility] did not have the equipment to do the additional testing because they are not fully equipped. Patient stated she is at emergency room right now and have been there since yesterday for these symptoms. Patient stated she need FMLA paperwork filled out and the doctor would have to fill out those CB [phone]

## 2024-06-30 NOTE — ED Provider Notes (Signed)
 [facility] AT [facility] Provider Note   CSN: [id number] Arrival date & time: 06/29/24  1836     Patient presents with: Chest Pain and Melena   Nicole Whitehead is a 43 y.o. female.  With past medical history of anal bleeding presents to emergency room with complaint of anal bleeding and hemorrhoids.  Patient reports this has been ongoing for 2 days.  She notes blood is bright red blood when she is straining to have a bowel movement.  It is only with having bowel movements.  She also notes left sided abdominal pain that seems to be getting worse.  She denies nausea vomiting or fever.  She does report some central chest tightness and headache denies shortness of breath or cough with this. No neck pain/ rigidity, no injury or fall.  Patient's not on blood thinner.  Has been trying topical nitroglycerin for anal fissures, this has helped.     Chest Pain Associated symptoms: abdominal pain        Prior to Admission medications   Medication Sig Start Date End Date Taking? Authorizing Provider  albuterol  (VENTOLIN  HFA) 108 (90 Base) MCG/ACT inhaler Inhale 2 puffs into the lungs every 6 (six) hours as needed for wheezing or shortness of breath. Patient not taking: Reported on 03/15/2024 02/19/24   Neda Jennet LABOR, MD  AMBULATORY NON FORMULARY MEDICATION Medication Name: Nitroglycerin ointment 0.125% use pea sized amount 3-4 times a day per rectum x 8 weeks 01/17/23   Nandigam, Kavitha V, MD  esomeprazole  (NEXIUM ) 40 MG capsule Take 1 capsule (40 mg total) by mouth daily. 12/29/23   Rollene Almarie LABOR, MD  famotidine  (PEPCID ) 40 MG tablet Take 1 tablet (40 mg total) by mouth at bedtime. 01/26/24   Rollene Almarie LABOR, MD  sertraline  (ZOLOFT ) 100 MG tablet Take 1 tablet (100 mg total) by mouth daily. 02/23/24   Rollene Almarie LABOR, MD    Allergies: Hydrocodone-acetaminophen , Oxycodone -acetaminophen , Sulfa antibiotics, Sulfamethoxazole-trimethoprim, and  Sulfonamide derivatives    Review of Systems  Cardiovascular:  Positive for chest pain.  Gastrointestinal:  Positive for abdominal pain.    Updated Vital Signs BP 117/81 (BP Location: Left Arm)   Pulse 71   Temp 98 F (36.7 C)   Resp 16   Ht 5' 7 (1.702 m)   Wt 79.4 kg   LMP 06/08/2024   SpO2 100%   BMI 27.41 kg/m   Physical Exam Vitals and nursing note reviewed.  Constitutional:      General: She is not in acute distress.    Appearance: She is not toxic-appearing.  HENT:     Head: Normocephalic and atraumatic.  Eyes:     General: No scleral icterus.    Conjunctiva/sclera: Conjunctivae normal.  Cardiovascular:     Rate and Rhythm: Normal rate and regular rhythm.     Pulses: Normal pulses.     Heart sounds: Normal heart sounds.  Pulmonary:     Effort: Pulmonary effort is normal. No respiratory distress.     Breath sounds: Normal breath sounds.  Abdominal:     General: Abdomen is flat. Bowel sounds are normal.     Palpations: Abdomen is soft.     Tenderness: There is abdominal tenderness.     Comments: LLQ abdominal pain.   Skin:    General: Skin is warm and dry.     Findings: No lesion.  Neurological:     General: No focal deficit present.     Mental Status:  She is alert and oriented to person, place, and time. Mental status is at baseline.     (all labs ordered are listed, but only abnormal results are displayed) Labs Reviewed  CBC - Abnormal; Notable for the following components:      Result Value   MCV 79.6 (*)    MCHC 36.7 (*)    All other components within normal limits  COMPREHENSIVE METABOLIC PANEL WITH GFR - Abnormal; Notable for the following components:   Calcium 8.7 (*)    All other components within normal limits  HCG, SERUM, QUALITATIVE  URINALYSIS, ROUTINE W REFLEX MICROSCOPIC  TROPONIN I (HIGH SENSITIVITY)  TROPONIN I (HIGH SENSITIVITY)    EKG: EKG Interpretation Date/Time:  Tuesday June 29 2024 19:13:42 EDT Ventricular Rate:   88 PR Interval:  126 QRS Duration:  68 QT Interval:  380 QTC Calculation: 459 R Axis:   44  Text Interpretation: Normal sinus rhythm with sinus arrhythmia T wave abnormality, consider inferior ischemia Confirmed by Freddi Hamilton [phone]) on 06/30/2024 7:59:18 AM  Radiology: CT Head Wo Contrast Result Date: 06/29/2024 CLINICAL DATA:  Dizziness and headaches EXAM: CT HEAD WITHOUT CONTRAST TECHNIQUE: Contiguous axial images were obtained from the base of the skull through the vertex without intravenous contrast. RADIATION DOSE REDUCTION: This exam was performed according to the departmental dose-optimization program which includes automated exposure control, adjustment of the mA and/or kV according to patient size and/or use of iterative reconstruction technique. COMPARISON:  None Available. FINDINGS: Brain: No evidence of acute infarction, hemorrhage, hydrocephalus, extra-axial collection or mass lesion/mass effect. Vascular: No hyperdense vessel or unexpected calcification. Skull: Normal. Negative for fracture or focal lesion. Sinuses/Orbits: No acute finding. Other: None. IMPRESSION: No acute intracranial abnormality noted. Electronically Signed   By: Oneil Devonshire M.D.   On: 06/29/2024 21:39   DG Chest 2 View Result Date: 06/29/2024 CLINICAL DATA:  Chest tightness EXAM: CHEST - 2 VIEW COMPARISON:  02/19/2024 FINDINGS: The heart size and mediastinal contours are within normal limits. Both lungs are clear. The visualized skeletal structures are unremarkable. IMPRESSION: No active cardiopulmonary disease. Electronically Signed   By: Franky Crease M.D.   On: 06/29/2024 20:43     Procedures   Medications Ordered in the ED - No data to display                                  Medical Decision Making Amount and/or Complexity of Data Reviewed Radiology: ordered.  Risk Prescription drug management.   This patient presents to the ED for concern of HA/CP/Abd pain, this involves an extensive  number of treatment options, and is a complaint that carries with it a high risk of complications and morbidity.  The differential diagnosis includes appendicitis, diverticulitis, colitis, pancreatitis, dissection    Co morbidities that complicate the patient evaluation  Anal Fissure   Additional history obtained:  Additional history obtained from urgent care visit yesterday in which patient was seen for chest pain headache and abdominal pain as well as rectal bleeding.   Lab Tests:  I personally interpreted labs.  The pertinent results include:   Hemoglobin is stable at 14.3 CMP without electrolyte abnormalities.  Normal liver and kidney function. Troponin 3 Hcg 3   Imaging Studies ordered:  I ordered imaging studies including CT head, chest x-ray I independently visualized and interpreted imaging which showed no acute findings I agree with the radiologist interpretation   Cardiac Monitoring: /  EKG:  The patient was maintained on a cardiac monitor.  I personally viewed and interpreted the cardiac monitored which showed an underlying rhythm of: normal sinus  Problem List / ED Course / Critical interventions / Medication management  Patient presents to emergency room with complaint of chest pain, headache and rectal bleeding.  She reports her headache began yesterday.  Her CT scan of her head is negative and she has no focal neurological deficits on my exam.  Her chest pain is described as chest tightness and she is questioning if this is related to a lot of stress or her upcoming trip to [place].  Chest x-ray shows no evidence of pneumonia or pneumothorax.  Her troponin is flat.  This does not seem consistent with ACS or dissection.  Also complains of left lower quadrant abdominal pain.  Her CT scan does show large fibroid but no other acute findings.  On my exam she does have hemorrhoid present with no obvious anal fissure.  She declined having rectal exam as this was done at  urgent care yesterday.  I will give her GI follow-up as well as OB/GYN follow-up for findings today. She declined any medication for pain or nausea. I have reviewed the patients home medicines and have made adjustments as needed   Plan F/u w/ PCP in 2-3d to ensure resolution of sx.  Patient was given return precautions. Patient stable for discharge at this time.  Patient educated on sx/dx and verbalized understanding of plan. Return to ER w/ new or worsening sx.       Final diagnoses:  Rectal bleeding  Uterine leiomyoma, unspecified location    ED Discharge Orders          Ordered    hydrocortisone  (ANUSOL -HC) 2.5 % rectal cream  2 times daily        06/30/24 1247    hydrocortisone  (ANUSOL -HC) 25 MG suppository  2 times daily        06/30/24 1247               Hartman Minahan, Warren SAILOR, PA-C 06/30/24 1255    Doretha Folks, MD 07/02/24 2239

## 2024-06-30 NOTE — Telephone Encounter (Signed)
 Spoke with patient regarding prior message . Advised patient per Dr.Olalere  FMLA forms can be processed once she follows up with primary care    Patient does have a f/u with PCP on 07/13/2024. Patient's voice was understanding.Nothing else further needed.

## 2024-07-01 NOTE — Telephone Encounter (Signed)
**Note De-identified  [place] Obfuscation** Please advise 

## 2024-07-02 NOTE — Telephone Encounter (Signed)
 Generally the ER doctor would be able to do a work note for being there. We usually do not do FMLA for 1 day and typically if we have not seen you for the current problem we cannot fill out fmla for it. Is she going to follow up with us  about this?

## 2024-07-13 ENCOUNTER — Encounter: Payer: Self-pay | Admitting: Internal Medicine

## 2024-07-13 ENCOUNTER — Ambulatory Visit: Admitting: Internal Medicine

## 2024-07-13 VITALS — BP 120/72 | HR 87 | Temp 98.2°F | Ht 67.0 in | Wt 183.0 lb

## 2024-07-13 DIAGNOSIS — D219 Benign neoplasm of connective and other soft tissue, unspecified: Secondary | ICD-10-CM | POA: Diagnosis not present

## 2024-07-13 DIAGNOSIS — K649 Unspecified hemorrhoids: Secondary | ICD-10-CM | POA: Diagnosis not present

## 2024-07-13 DIAGNOSIS — R197 Diarrhea, unspecified: Secondary | ICD-10-CM | POA: Diagnosis not present

## 2024-07-13 MED ORDER — AMBULATORY NON FORMULARY MEDICATION: 1 refills | Status: AC

## 2024-07-13 MED ORDER — HYDROCORTISONE ACETATE 25 MG RE SUPP: 25.0000 mg | Freq: Two times a day (BID) | RECTAL | 0 refills | Status: AC

## 2024-07-13 MED ORDER — AZITHROMYCIN 250 MG PO TABS: 1000.0000 mg | ORAL_TABLET | Freq: Once | ORAL | 0 refills | Status: AC

## 2024-07-13 MED ORDER — ONDANSETRON HCL 4 MG PO TABS: 4.0000 mg | ORAL_TABLET | Freq: Three times a day (TID) | ORAL | 0 refills | Status: AC | PRN

## 2024-07-13 MED ORDER — HYDROCORTISONE (PERIANAL) 2.5 % EX CREA: 1.0000 | TOPICAL_CREAM | Freq: Two times a day (BID) | CUTANEOUS | 0 refills | Status: AC

## 2024-07-13 NOTE — Patient Instructions (Addendum)
 We will check for bacteria in the diarrhea.   We have sent in the antibiotics to take azithromycin  4 pills once.

## 2024-07-13 NOTE — Progress Notes (Signed)
 Subjective:   Patient ID: Nicole Whitehead, female    DOB: [birth year], 43 y.o.   MRN: [id number]  Discussed the use of AI scribe software for clinical note transcription with the patient, who gave verbal consent to proceed. History of Present Illness Nicole Whitehead is a 43 year old female who presents with persistent bloody diarrhea and abdominal pain following recent travel.  Her symptoms began after returning from her wedding and honeymoon trip, which started on July 01, 2024. She initially experienced severe abdominal pain and noticed blood and tissue in her stool the day after her wedding on June 26, 2024. She was unable to defecate initially, but when she did, it was accompanied by blood.  During her hospital visit, she was told that a CT scan showed a protruding hemorrhoid and fibroids, one of which was reportedly about eight centimeters. She was not previously aware of the fibroids as she did not experience heavy menstrual cycles or significant cramping.  While on her honeymoon, she developed traveler's diarrhea, which began on July 03, 2024, and has persisted for ten days. The diarrhea is frequent, occurring up to ten times in an hour, and is accompanied by blood. She has tried Imodium without relief. She reports a variety of stool colors, including clearish brown, black, green, and khaki.  She experiences bloating and abdominal pain, which is visibly noticeable when her abdomen swells. She feels nauseous but has not vomited. No recent fevers since returning home but notes feeling dizzy, possibly due to dehydration from diarrhea. She has been consuming water but plans to increase her intake of electrolytes.  Her past medical history includes a known allergy to sulfa drugs and pain medications. She also reports that her nitroglycerin medication was lost during her travels. FMLA needs for appointments7/15/25 to 07/13/24  Review of Systems  Constitutional:  Positive for activity  change and fatigue.  HENT: Negative.    Eyes: Negative.   Respiratory:  Negative for cough, chest tightness and shortness of breath.   Cardiovascular:  Negative for chest pain, palpitations and leg swelling.  Gastrointestinal:  Positive for abdominal distention, abdominal pain, blood in stool and diarrhea. Negative for constipation, nausea and vomiting.  Musculoskeletal: Negative.   Skin: Negative.   Neurological: Negative.   Psychiatric/Behavioral: Negative.      Objective:  Physical Exam Constitutional:      Appearance: She is well-developed.  HENT:     Head: Normocephalic and atraumatic.  Cardiovascular:     Rate and Rhythm: Normal rate and regular rhythm.  Pulmonary:     Effort: Pulmonary effort is normal. No respiratory distress.     Breath sounds: Normal breath sounds. No wheezing or rales.  Abdominal:     General: Bowel sounds are normal. There is no distension.     Palpations: Abdomen is soft.     Tenderness: There is abdominal tenderness. There is no rebound.  Musculoskeletal:     Cervical back: Normal range of motion.  Skin:    General: Skin is warm and dry.  Neurological:     Mental Status: She is alert and oriented to person, place, and time.     Coordination: Coordination normal.     Vitals:   07/13/24 0926  BP: 120/72  Pulse: 87  SpO2: 98%  Weight: 183 lb (83 kg)  Height: 5' 7 (1.702 m)    Assessment and Plan Assessment & Plan Chronic infectious diarrhea (traveler's diarrhea)   Diarrhea is likely bacterial due to the presence  of blood, accompanied by nausea, bloating, and dizziness without vomiting or fever. Order a stool test for bacterial infections. Prescribe azithromycin , 4 pills at once or 2 pills followed by another 2 after 30 minutes. Advise electrolyte replacement with Gatorade, Pedialyte, or Liquid IV. Prescribe anti-nausea medication as needed. Refer to Gastroenterology for further evaluation.  Hemorrhoids with bleeding   Bleeding  hemorrhoid is exacerbated by diarrhea. Refer to Gastroenterology for evaluation and potential treatment.  Uterine fibroid   An 8 cm fibroid was identified on CT scan. Further evaluation is needed. Refer to OBGYN for evaluation.

## 2024-07-14 ENCOUNTER — Telehealth: Payer: Self-pay | Admitting: Internal Medicine

## 2024-07-14 ENCOUNTER — Encounter: Payer: Self-pay | Admitting: Internal Medicine

## 2024-07-14 ENCOUNTER — Telehealth: Payer: Self-pay

## 2024-07-14 NOTE — Telephone Encounter (Signed)
 Patient picked up FMLA paperwork this afternoon and was given a copy of the paperwork at 4:40 pm

## 2024-07-14 NOTE — Telephone Encounter (Signed)
 Patient wanted to make provider aware that she dropped off a stool sample at the lab downstairs.

## 2024-07-15 DIAGNOSIS — D219 Benign neoplasm of connective and other soft tissue, unspecified: Secondary | ICD-10-CM | POA: Insufficient documentation

## 2024-07-15 DIAGNOSIS — K649 Unspecified hemorrhoids: Secondary | ICD-10-CM | POA: Insufficient documentation

## 2024-07-15 DIAGNOSIS — R197 Diarrhea, unspecified: Secondary | ICD-10-CM | POA: Insufficient documentation

## 2024-07-15 NOTE — Assessment & Plan Note (Signed)
 Referral to GI for persistent and intermittently bleeding hemorrhoids.

## 2024-07-15 NOTE — Assessment & Plan Note (Signed)
 Referral to ob/gyn given size of fibroids. She is not having pain or excessive bleeding.

## 2024-07-15 NOTE — Assessment & Plan Note (Signed)
 Checking GI profile as bloody diarrhea makes bacterial more likely. Rx azithromycin  1000 mg once pending results and adjust as needed.

## 2024-07-16 LAB — GI PROFILE, STOOL, PCR
Adenovirus F 40/41: NOT DETECTED
Astrovirus: NOT DETECTED
C difficile toxin A/B: NOT DETECTED
Campylobacter: NOT DETECTED
Cryptosporidium: NOT DETECTED
Cyclospora cayetanensis: NOT DETECTED
Entamoeba histolytica: NOT DETECTED
Enteroaggregative E coli: DETECTED — AB
Enteropathogenic E coli: DETECTED — AB
Enterotoxigenic E coli: DETECTED — AB
Giardia lamblia: NOT DETECTED
Norovirus GI/GII: NOT DETECTED
Plesiomonas shigelloides: NOT DETECTED
Rotavirus A: NOT DETECTED
Salmonella: NOT DETECTED
Sapovirus: NOT DETECTED
Shiga-toxin-producing E coli: NOT DETECTED
Shigella/Enteroinvasive E coli: NOT DETECTED
Vibrio cholerae: NOT DETECTED
Vibrio: NOT DETECTED
Yersinia enterocolitica: NOT DETECTED

## 2024-07-19 ENCOUNTER — Ambulatory Visit: Payer: Self-pay | Admitting: Internal Medicine

## 2024-07-22 ENCOUNTER — Other Ambulatory Visit: Payer: Self-pay | Admitting: Internal Medicine

## 2024-07-22 DIAGNOSIS — Z1231 Encounter for screening mammogram for malignant neoplasm of breast: Secondary | ICD-10-CM

## 2024-08-09 ENCOUNTER — Ambulatory Visit
Admission: RE | Admit: 2024-08-09 | Discharge: 2024-08-09 | Disposition: A | Discharge status: Home / Self Care | Source: Ambulatory Visit | Attending: Internal Medicine | Admitting: Internal Medicine

## 2024-08-09 DIAGNOSIS — Z1231 Encounter for screening mammogram for malignant neoplasm of breast: Secondary | ICD-10-CM

## 2024-08-17 ENCOUNTER — Ambulatory Visit: Payer: Self-pay | Admitting: Internal Medicine

## 2024-08-17 LAB — HM MAMMOGRAPHY

## 2024-10-12 ENCOUNTER — Encounter: Payer: Self-pay | Admitting: Internal Medicine
